# Patient Record
Sex: Female | Born: 1940 | Race: White | Hispanic: No | Smoking: Current every day smoker
Health system: Southern US, Community
[De-identification: ages and names within clinical notes are randomized; demographics above are authoritative.]

## PROBLEM LIST (undated history)

## (undated) DIAGNOSIS — E039 Hypothyroidism, unspecified: Secondary | ICD-10-CM

## (undated) DIAGNOSIS — K219 Gastro-esophageal reflux disease without esophagitis: Secondary | ICD-10-CM

## (undated) DIAGNOSIS — G4700 Insomnia, unspecified: Secondary | ICD-10-CM

## (undated) DIAGNOSIS — M1990 Unspecified osteoarthritis, unspecified site: Secondary | ICD-10-CM

## (undated) DIAGNOSIS — F329 Major depressive disorder, single episode, unspecified: Secondary | ICD-10-CM

## (undated) DIAGNOSIS — F419 Anxiety disorder, unspecified: Secondary | ICD-10-CM

## (undated) DIAGNOSIS — R002 Palpitations: Secondary | ICD-10-CM

## (undated) DIAGNOSIS — K5790 Diverticulosis of intestine, part unspecified, without perforation or abscess without bleeding: Secondary | ICD-10-CM

## (undated) DIAGNOSIS — K862 Cyst of pancreas: Secondary | ICD-10-CM

## (undated) DIAGNOSIS — F32A Depression, unspecified: Secondary | ICD-10-CM

## (undated) DIAGNOSIS — K449 Diaphragmatic hernia without obstruction or gangrene: Secondary | ICD-10-CM

## (undated) DIAGNOSIS — K649 Unspecified hemorrhoids: Secondary | ICD-10-CM

## (undated) DIAGNOSIS — K589 Irritable bowel syndrome without diarrhea: Secondary | ICD-10-CM

## (undated) DIAGNOSIS — E7800 Pure hypercholesterolemia, unspecified: Secondary | ICD-10-CM

## (undated) DIAGNOSIS — J449 Chronic obstructive pulmonary disease, unspecified: Secondary | ICD-10-CM

## (undated) DIAGNOSIS — I10 Essential (primary) hypertension: Secondary | ICD-10-CM

## (undated) DIAGNOSIS — K529 Noninfective gastroenteritis and colitis, unspecified: Secondary | ICD-10-CM

## (undated) DIAGNOSIS — I729 Aneurysm of unspecified site: Secondary | ICD-10-CM

## (undated) DIAGNOSIS — M797 Fibromyalgia: Secondary | ICD-10-CM

## (undated) HISTORY — DX: Cyst of pancreas: K86.2

## (undated) HISTORY — PX: APPENDECTOMY: SHX54

## (undated) HISTORY — DX: Irritable bowel syndrome, unspecified: K58.9

## (undated) HISTORY — DX: Gastro-esophageal reflux disease without esophagitis: K21.9

## (undated) HISTORY — DX: Noninfective gastroenteritis and colitis, unspecified: K52.9

## (undated) HISTORY — DX: Chronic obstructive pulmonary disease, unspecified: J44.9

## (undated) HISTORY — DX: Anxiety disorder, unspecified: F41.9

## (undated) HISTORY — DX: Diaphragmatic hernia without obstruction or gangrene: K44.9

## (undated) HISTORY — DX: Insomnia, unspecified: G47.00

## (undated) HISTORY — DX: Unspecified osteoarthritis, unspecified site: M19.90

## (undated) HISTORY — DX: Aneurysm of unspecified site: I72.9

## (undated) HISTORY — DX: Essential (primary) hypertension: I10

## (undated) HISTORY — DX: Diverticulosis of intestine, part unspecified, without perforation or abscess without bleeding: K57.90

## (undated) HISTORY — DX: Fibromyalgia: M79.7

## (undated) HISTORY — DX: Unspecified hemorrhoids: K64.9

## (undated) HISTORY — PX: ABDOMINAL HYSTERECTOMY: SHX81

## (undated) HISTORY — PX: CHOLECYSTECTOMY: SHX55

## (undated) HISTORY — DX: Palpitations: R00.2

---

## 2001-06-21 ENCOUNTER — Encounter: Payer: Self-pay | Admitting: *Deleted

## 2001-06-21 ENCOUNTER — Ambulatory Visit (HOSPITAL_COMMUNITY): Admission: RE | Admit: 2001-06-21 | Discharge: 2001-06-21 | Payer: Self-pay | Admitting: *Deleted

## 2001-07-02 ENCOUNTER — Encounter: Payer: Self-pay | Admitting: *Deleted

## 2001-07-02 ENCOUNTER — Ambulatory Visit (HOSPITAL_COMMUNITY): Admission: RE | Admit: 2001-07-02 | Discharge: 2001-07-02 | Payer: Self-pay | Admitting: *Deleted

## 2003-05-13 ENCOUNTER — Ambulatory Visit (HOSPITAL_COMMUNITY): Admission: RE | Admit: 2003-05-13 | Discharge: 2003-05-13 | Payer: Self-pay | Admitting: Obstetrics and Gynecology

## 2010-01-09 HISTORY — PX: COLONOSCOPY: SHX174

## 2010-01-09 HISTORY — PX: ESOPHAGOGASTRODUODENOSCOPY ENDOSCOPY: SHX5814

## 2013-12-15 ENCOUNTER — Telehealth: Payer: Self-pay | Admitting: Gastroenterology

## 2013-12-15 ENCOUNTER — Encounter: Payer: Self-pay | Admitting: Gastroenterology

## 2014-01-19 ENCOUNTER — Ambulatory Visit (INDEPENDENT_AMBULATORY_CARE_PROVIDER_SITE_OTHER): Payer: Medicare Other | Admitting: Nurse Practitioner

## 2014-01-19 ENCOUNTER — Other Ambulatory Visit: Payer: Self-pay

## 2014-01-19 ENCOUNTER — Encounter: Payer: Self-pay | Admitting: Nurse Practitioner

## 2014-01-19 ENCOUNTER — Telehealth: Payer: Self-pay | Admitting: Nurse Practitioner

## 2014-01-19 VITALS — BP 141/83 | HR 94 | Temp 97.3°F | Ht 60.0 in | Wt 93.2 lb

## 2014-01-19 DIAGNOSIS — K862 Cyst of pancreas: Secondary | ICD-10-CM

## 2014-01-19 DIAGNOSIS — R1084 Generalized abdominal pain: Secondary | ICD-10-CM

## 2014-01-19 DIAGNOSIS — K589 Irritable bowel syndrome without diarrhea: Secondary | ICD-10-CM

## 2014-01-19 DIAGNOSIS — R109 Unspecified abdominal pain: Secondary | ICD-10-CM | POA: Insufficient documentation

## 2014-01-19 DIAGNOSIS — R634 Abnormal weight loss: Secondary | ICD-10-CM

## 2014-01-19 DIAGNOSIS — K219 Gastro-esophageal reflux disease without esophagitis: Secondary | ICD-10-CM

## 2014-01-19 MED ORDER — PEG-KCL-NACL-NASULF-NA ASC-C 100 G PO SOLR: 1.0000 | Freq: Once | ORAL | Status: DC

## 2014-01-19 NOTE — Telephone Encounter (Signed)
Please request the patient's records from [facility] related to her pancreatic cyst and drainage.

## 2014-01-19 NOTE — Progress Notes (Signed)
Primary Care Physician:  No primary care provider on file. Primary Gastroenterologist:  Dr.  Oneida Alar  Chief Complaint  Patient presents with  . Abdominal Pain  . Irritable Bowel Syndrome    HPI:   74 year old female presents on referral from internal medicine in [place] for gastroparesis, IBS symptoms, acid reflux, dysphagia, unintentional weight loss, weakaness/fatigue, and history of colon polyps. Per PCP records patient was having constipation, started on Amitiza which caused loose stools but continued impaction (per patient.) Last colonoscopy and EGD was in approximately 2012 with Dr. Posey Pronto.   Currently complains of an odd/salty taste in her mouth which has her unable to eat much food and subsequent weight loss. Has lost 25 pounds in the past year. Also having abdominal pain about twice per month which persists until she has a bowel movement. States she is typically unable to have bowel movements but about once a day she needs to physically disimpact herself daily. Her stools are typically hard and small. Had an episode of colitis 2 years ago and has not had any hematochezia since then. States she has dark stools but they're hard and not sticky. Does not take Pepto Bismol or Iron pills. Admits solid food dysphagia, denies pill dysphagia and difficulty with liquids. Has "bad" GERD symptoms. Is currently on Omeprazole 40mg  bid which is ineffective. Attempted Nexium without relief and insurance would not cover it anyway. Takes Bentyl for pain which tends to help. Attempted Amitiza which caused loose stools but per the patient her loose stools were still impacted. Takes Dulcolax and fiber supplement daily. Takes Hydrocodone as needed, typically twice a week. Has not tried Miralax. Has a history of pancreatic cyst which was drained at [facility] and recommended yearly imaging to evaluate, last CT 11/2012. Denies any other upper or lower GI symptoms.  Past Medical History  Diagnosis Date  . Anxiety     . HTN (hypertension)   . Arthritis   . Acid reflux   . Colitis   . Diverticulosis   . IBS (irritable bowel syndrome)   . Fibromyalgia   . Insomnia   . Pancreatic cyst   . COPD (chronic obstructive pulmonary disease)   . Palpitations   . Aneurysm   . Hiatal hernia   . Hemorrhoids     No past surgical history on file.  Current Outpatient Prescriptions  Medication Sig Dispense Refill  . ALPRAZolam (XANAX) 1 MG tablet Take 1 mg by mouth 2 (two) times daily as needed.   2  . aspirin 81 MG tablet Take 81 mg by mouth daily.    Marland Kitchen CALCIUM 500/D 500-200 MG-UNIT per tablet Take 1 tablet by mouth 2 (two) times daily.   2  . Cholecalciferol (VITAMIN D3) 2000 UNITS TABS Take 2,000 Units by mouth 2 (two) times daily.    Marland Kitchen conjugated estrogens (PREMARIN) vaginal cream Place 1 Applicatorful vaginally daily.    Marland Kitchen dicyclomine (BENTYL) 20 MG tablet Take 20 mg by mouth as needed.   2  . docusate sodium (COLACE) 100 MG capsule Take 100 mg by mouth daily as needed for mild constipation.    . DULoxetine (CYMBALTA) 60 MG capsule Take 60 mg by mouth daily.   0  . FIBER PO Take by mouth daily.    Marland Kitchen HYDROcodone-acetaminophen (NORCO/VICODIN) 5-325 MG per tablet Take 1 tablet by mouth every 4 (four) hours as needed.   0  . ipratropium-albuterol (DUONEB) 0.5-2.5 (3) MG/3ML SOLN Take 3 mLs by nebulization.    Marland Kitchen  levothyroxine (SYNTHROID, LEVOTHROID) 50 MCG tablet Take 100 mcg by mouth daily before breakfast.   5  . lisinopril (PRINIVIL,ZESTRIL) 20 MG tablet Take 20 mg by mouth daily.   6  . magnesium hydroxide (MILK OF MAGNESIA) 400 MG/5ML suspension Take 5 mLs by mouth daily as needed for mild constipation.    . Multiple Vitamin (MULTIVITAMIN) capsule Take 1 capsule by mouth daily.    . Omega-3 Fatty Acids (OMEGA-3 FISH OIL) 1200 MG CAPS Take 1,200 mg by mouth daily.    Marland Kitchen omeprazole (PRILOSEC) 40 MG capsule Take 40 mg by mouth 2 (two) times daily.    . Probiotic Product (PROBIOTIC DAILY PO) Take by mouth  daily.    Marland Kitchen tetrahydrozoline-zinc (VISINE-AC) 0.05-0.25 % ophthalmic solution 2 drops as needed.     No current facility-administered medications for this visit.    Allergies as of 01/19/2014 - Review Complete 01/19/2014  Allergen Reaction Noted  . Advil [ibuprofen]  01/19/2014  . Ciprofloxacin  01/19/2014  . Codeine  01/19/2014  . Metronidazole  01/19/2014  . Sulfa antibiotics  01/19/2014    No family history on file.  History   Social History  . Marital Status: Married    Spouse Name: N/A    Number of Children: N/A  . Years of Education: N/A   Occupational History  . Not on file.   Social History Main Topics  . Smoking status: Never Smoker   . Smokeless tobacco: Not on file  . Alcohol Use: No  . Drug Use: No  . Sexual Activity: Not on file   Other Topics Concern  . Not on file   Social History Narrative  . No narrative on file    Review of Systems: Gen: Denies any fever, chills, fatigue, weight loss, lack of appetite.  CV: Denies chest pain, heart palpitations, peripheral edema, syncope.  Resp: Denies shortness of breath at rest or with exertion. Denies wheezing or cough.  GI: Denies dysphagia or odynophagia. Denies jaundice, hematemesis, fecal incontinence. GU : Denies urinary burning, urinary frequency, urinary hesitancy MS: Denies joint pain, muscle weakness, cramps, or limitation of movement.  Derm: Denies rash, itching, dry skin Psych: Denies depression, anxiety, memory loss, and confusion Heme: Denies bruising, bleeding, and enlarged lymph nodes.  Physical Exam: BP 141/83 mmHg  Pulse 94  Temp(Src) 97.3 F (36.3 C) (Oral)  Ht 5' (1.524 m)  Wt 93 lb 3.2 oz (42.275 kg)  BMI 18.20 kg/m2 General:   Alert and oriented. Pleasant and cooperative. Well-nourished and well-developed.  Head:  Normocephalic and atraumatic. Eyes:  Without icterus, sclera clear and conjunctiva pink.  Ears:  Normal auditory acuity. Mouth:  No deformity or lesions, oral mucosa  pink. No OP edema. Neck:  Supple, without mass or thyromegaly. Lungs:  Clear to auscultation bilaterally. No wheezes, rales, or rhonchi. No distress.  Heart:  S1, S2 present without murmurs appreciated.  Abdomen:  +BS, soft, non-distended. TTP lower abdomen. No HSM noted. No guarding or rebound. No masses appreciated.  Rectal:  Deferred  Pulses:  Normal DP pulses noted. Extremities:  Without clubbing or edema. Neurologic:  Alert and  oriented x4;  grossly normal neurologically. Skin:  Intact without significant lesions or rashes. Cervical Nodes:  No significant cervical adenopathy. Psych:  Alert and cooperative. Normal mood and affect.     01/19/2014 2:42 PM

## 2014-01-19 NOTE — Patient Instructions (Signed)
1. Try taking OTC Miralax every other day and see if that helps with your bowel movements. 2. We will try you on a different acid reflux pill called Dexilant because you have tried and failed Prilosec and Nexium. We will give you samples to try and please let us know if it works, in which case we can call in a prescription for it. 3. We will schedule a CT scan to evaluate your symptoms including abdominal pain, unintentional weight loss, and history of pancreatic cyst. 4. We will schedule you for a colonoscopy and endoscopy to further evaluate your symptoms. 5. Further recommendations to follow based on the results of your procedures.

## 2014-01-19 NOTE — Telephone Encounter (Signed)
I faxed [facility] a signed release for her medical records this afternoon before she left. Once I get them I will put in your chair.

## 2014-01-20 NOTE — Assessment & Plan Note (Signed)
History of pancreatic cyst s/p drainage at [facility]. Typically has a CT scan yearly to evaluate. Also now with abdominal pain, constipation/impaction, and GERD symptoms. Will order a CT abdomen and pelvis as she is due as well to evaluate for any acute process to explain her symptoms.

## 2014-01-20 NOTE — Assessment & Plan Note (Signed)
Abdominal pain with a history of pancreatic cyst s/p drainage at [facility]. Pain typically with some relief with BM, bentyl, and hydrocodone. Likely related to IBS symptoms, but patient did not tolerate Amitiza. Will try miralax for gentler effect, CT scan to rule out other acute process, as well as TCS/EGD with Dr. Oneida Alar.  Proceed with colonoscopy + EGD with Dr. Oneida Alar in the near future. The risks, benefits, and alternatives have been discussed in detail with the patient. They state understanding and desire to proceed.

## 2014-01-20 NOTE — Assessment & Plan Note (Signed)
Persistent GERD symptoms failed omeprazole and nexium. Will trial Dexilant for better control with sample x 2 weeks. If effective can send in an Rx.  Will also plan on add on EGD to colonoscopy to evaluate for gastritis/esophagitis as well as dysphagia.  Proceed with colonoscopy + EGD with Dr. Oneida Alar in the near future. The risks, benefits, and alternatives have been discussed in detail with the patient. They state understanding and desire to proceed.

## 2014-01-20 NOTE — Assessment & Plan Note (Signed)
Unintentional weight loss of 25 pounds in the past year with abdominal pain and a history of pancreatic cyst s/p drainage at [facility]. Bentyl helps with her pain. Also with weakness and history of colon polyps. Will order CT for symptoms given history and presentation to rule out acute process. Will followup with TCS + EGD with Dr. Oneida Alar.  Proceed with colonoscopy + EGD with Dr. Oneida Alar in the near future. The risks, benefits, and alternatives have been discussed in detail with the patient. They state understanding and desire to proceed.

## 2014-01-20 NOTE — Assessment & Plan Note (Signed)
Patient with abdominal pain sometimes relieved with a bowel movement, constipation which she states she has to "dig out." Was trialed on Amitiza which cause "loose stools that were still impacted." Continue Bentyl which is somewhat effective for her abdominal pain and try Miralax for gentler effect over Amitiza. Cannot rule out more acute process, however. Will follow with TCS + EGD with Dr. Oneida Alar.  Proceed with colonoscopy + EGD with Dr. Oneida Alar in the near future. The risks, benefits, and alternatives have been discussed in detail with the patient. They state understanding and desire to proceed.

## 2014-01-22 ENCOUNTER — Other Ambulatory Visit: Payer: Self-pay

## 2014-01-22 ENCOUNTER — Ambulatory Visit (HOSPITAL_COMMUNITY)
Admission: RE | Admit: 2014-01-22 | Discharge: 2014-01-22 | Disposition: A | Discharge status: Home / Self Care | Payer: Medicare Other | Source: Ambulatory Visit | Attending: Nurse Practitioner | Admitting: Nurse Practitioner

## 2014-01-22 ENCOUNTER — Encounter (HOSPITAL_COMMUNITY): Payer: Self-pay

## 2014-01-22 ENCOUNTER — Encounter (HOSPITAL_COMMUNITY)
Admission: RE | Admit: 2014-01-22 | Discharge: 2014-01-22 | Disposition: A | Discharge status: Home / Self Care | Payer: Medicare Other | Source: Ambulatory Visit | Attending: Gastroenterology | Admitting: Gastroenterology

## 2014-01-22 DIAGNOSIS — R634 Abnormal weight loss: Secondary | ICD-10-CM | POA: Insufficient documentation

## 2014-01-22 DIAGNOSIS — R1084 Generalized abdominal pain: Secondary | ICD-10-CM | POA: Diagnosis not present

## 2014-01-22 HISTORY — DX: Major depressive disorder, single episode, unspecified: F32.9

## 2014-01-22 HISTORY — DX: Depression, unspecified: F32.A

## 2014-01-22 HISTORY — DX: Hypothyroidism, unspecified: E03.9

## 2014-01-22 HISTORY — DX: Pure hypercholesterolemia, unspecified: E78.00

## 2014-01-22 LAB — CBC
HEMATOCRIT: 37.8 % (ref 36.0–46.0)
HEMOGLOBIN: 12.6 g/dL (ref 12.0–15.0)
MCH: 30.5 pg (ref 26.0–34.0)
MCHC: 33.3 g/dL (ref 30.0–36.0)
MCV: 91.5 fL (ref 78.0–100.0)
PLATELETS: 276 10*3/uL (ref 150–400)
RBC: 4.13 MIL/uL (ref 3.87–5.11)
RDW: 12.4 % (ref 11.5–15.5)
WBC: 7.5 10*3/uL (ref 4.0–10.5)

## 2014-01-22 LAB — BASIC METABOLIC PANEL
ANION GAP: 6 (ref 5–15)
BUN: 13 mg/dL (ref 6–23)
CO2: 31 mmol/L (ref 19–32)
CREATININE: 0.9 mg/dL (ref 0.50–1.10)
Calcium: 9.7 mg/dL (ref 8.4–10.5)
Chloride: 99 mEq/L (ref 96–112)
GFR, EST AFRICAN AMERICAN: 73 mL/min — AB (ref >90)
GFR, EST NON AFRICAN AMERICAN: 63 mL/min — AB (ref >90)
Glucose, Bld: 75 mg/dL (ref 70–99)
Potassium: 4.4 mmol/L (ref 3.5–5.1)
Sodium: 136 mmol/L (ref 135–145)

## 2014-01-22 LAB — POCT I-STAT CREATININE: Creatinine, Ser: 1 mg/dL (ref 0.50–1.10)

## 2014-01-22 MED ORDER — IOHEXOL 300 MG/ML  SOLN
100.0000 mL | Freq: Once | INTRAMUSCULAR | Status: AC | PRN
  Administered 2014-01-22: 100 mL via INTRAVENOUS

## 2014-01-22 MED ORDER — NA SULFATE-K SULFATE-MG SULF 17.5-3.13-1.6 GM/177ML PO SOLN: 1.0000 | ORAL | Status: DC

## 2014-01-22 NOTE — Patient Instructions (Signed)
GEORGENA WEISHEIT  01/22/2014   Your procedure is scheduled on:   01/27/2014  Report to [facility] at  645  AM.  Call this number if you have problems the morning of surgery: [phone]   Remember:   Do not eat food or drink liquids after midnight.   Take these medicines the morning of surgery with A SIP OF WATER: xanax, cymbalta, norco, synthroid, lisinopril  Do not wear jewelry, make-up or nail polish.  Do not wear lotions, powders, or perfumes.  Do not shave 48 hours prior to surgery. Men may shave face and neck.  Do not bring valuables to the hospital.  [facility] is not responsible for any belongings or valuables.               Contacts, dentures or bridgework may not be worn into surgery.  Leave suitcase in the car. After surgery it may be brought to your room.  For patients admitted to the hospital, discharge time is determined by your treatment team.               Patients discharged the day of surgery will not be allowed to drive home.  Name and phone number of your driver: family  Special Instructions: N/A   Please read over the following fact sheets that you were given: Pain Booklet, Coughing and Deep Breathing, Surgical Site Infection Prevention, Anesthesia Post-op Instructions and Care and Recovery After Surgery Esophagogastroduodenoscopy Esophagogastroduodenoscopy (EGD) is a procedure to examine the lining of the esophagus, stomach, and first part of the small intestine (duodenum). A long, flexible, lighted tube with a camera attached (endoscope) is inserted down the throat to view these organs. This procedure is done to detect problems or abnormalities, such as inflammation, bleeding, ulcers, or growths, in order to treat them. The procedure lasts about 5-20 minutes. It is usually an outpatient procedure, but it may need to be performed in emergency cases in the hospital. LET YOUR CAREGIVER KNOW ABOUT:   Allergies to food or medicine.  All medicines you are  taking, including vitamins, herbs, eyedrops, and over-the-counter medicines and creams.  Use of steroids (by mouth or creams).  Previous problems you or members of your family have had with the use of anesthetics.  Any blood disorders you have.  Previous surgeries you have had.  Other health problems you have.  Possibility of pregnancy, if this applies. RISKS AND COMPLICATIONS  Generally, EGD is a safe procedure. However, as with any procedure, complications can occur. Possible complications include:  Infection.  Bleeding.  Tearing (perforation) of the esophagus, stomach, or duodenum.  Difficulty breathing or not being able to breath.  Excessive sweating.  Spasms of the larynx.  Slowed heartbeat.  Low blood pressure. BEFORE THE PROCEDURE  Do not eat or drink anything for 6-8 hours before the procedure or as directed by your caregiver.  Ask your caregiver about changing or stopping your regular medicines.  If you wear dentures, be prepared to remove them before the procedure.  Arrange for someone to drive you home after the procedure. PROCEDURE   A vein will be accessed to give medicines and fluids. A medicine to relax you (sedative) and a pain reliever will be given through that access into the vein.  A numbing medicine (local anesthetic) may be sprayed on your throat for comfort and to stop you from gagging or coughing.  A mouth guard may be placed in your mouth to protect your  teeth and to keep you from biting on the endoscope.  You will be asked to lie on your left side.  The endoscope is inserted down your throat and into the esophagus, stomach, and duodenum.  Air is put through the endoscope to allow your caregiver to view the lining of your esophagus clearly.  The esophagus, stomach, and duodenum is then examined. During the exam, your caregiver may:  Remove tissue to be examined under a microscope (biopsy) for inflammation, infection, or other medical  problems.  Remove growths.  Remove objects (foreign bodies) that are stuck.  Treat any bleeding with medicines or other devices that stop tissues from bleeding (hot cautery, clipping devices).  Widen (dilate) or stretch narrowed areas of the esophagus and stomach.  The endoscope will then be withdrawn. AFTER THE PROCEDURE  You will be taken to a recovery area to be monitored. You will be able to go home once you are stable and alert.  Do not eat or drink anything until the local anesthetic and numbing medicines have worn off. You may choke.  It is normal to feel bloated, have pain with swallowing, or have a sore throat for a short time. This will wear off.  Your caregiver should be able to discuss his or her findings with you. It will take longer to discuss the test results if any biopsies were taken. Document Released: 04/28/2004 Document Revised: 05/12/2013 Document Reviewed: 11/29/2011 [facility] Patient Information 2015 [place], [place]. This information is not intended to replace advice given to you by your health care provider. Make sure you discuss any questions you have with your health care provider. Colonoscopy A colonoscopy is an exam to look at the entire large intestine (colon). This exam can help find problems such as tumors, polyps, inflammation, and areas of bleeding. The exam takes about 1 hour.  LET [facility] CARE PROVIDER KNOW ABOUT:   Any allergies you have.  All medicines you are taking, including vitamins, herbs, eye drops, creams, and over-the-counter medicines.  Previous problems you or members of your family have had with the use of anesthetics.  Any blood disorders you have.  Previous surgeries you have had.  Medical conditions you have. RISKS AND COMPLICATIONS  Generally, this is a safe procedure. However, as with any procedure, complications can occur. Possible complications include:  Bleeding.  Tearing or rupture of the colon wall.  Reaction to  medicines given during the exam.  Infection (rare). BEFORE THE PROCEDURE   Ask your health care provider about changing or stopping your regular medicines.  You may be prescribed an oral bowel prep. This involves drinking a large amount of medicated liquid, starting the day before your procedure. The liquid will cause you to have multiple loose stools until your stool is almost clear or light green. This cleans out your colon in preparation for the procedure.  Do not eat or drink anything else once you have started the bowel prep, unless your health care provider tells you it is safe to do so.  Arrange for someone to drive you home after the procedure. PROCEDURE   You will be given medicine to help you relax (sedative).  You will lie on your side with your knees bent.  A long, flexible tube with a light and camera on the end (colonoscope) will be inserted through the rectum and into the colon. The camera sends video back to a computer screen as it moves through the colon. The colonoscope also releases carbon dioxide gas to  inflate the colon. This helps your health care provider see the area better.  During the exam, your health care provider may take a small tissue sample (biopsy) to be examined under a microscope if any abnormalities are found.  The exam is finished when the entire colon has been viewed. AFTER THE PROCEDURE   Do not drive for 24 hours after the exam.  You may have a small amount of blood in your stool.  You may pass moderate amounts of gas and have mild abdominal cramping or bloating. This is caused by the gas used to inflate your colon during the exam.  Ask when your test results will be ready and how you will get your results. Make sure you get your test results. Document Released: 12/24/1999 Document Revised: 10/16/2012 Document Reviewed: 09/02/2012 [facility] Patient Information 2015 [place], [place]. This information is not intended to replace advice given to you  by your health care provider. Make sure you discuss any questions you have with your health care provider. PATIENT INSTRUCTIONS POST-ANESTHESIA  IMMEDIATELY FOLLOWING SURGERY:  Do not drive or operate machinery for the first twenty four hours after surgery.  Do not make any important decisions for twenty four hours after surgery or while taking narcotic pain medications or sedatives.  If you develop intractable nausea and vomiting or a severe headache please notify your doctor immediately.  FOLLOW-UP:  Please make an appointment with your surgeon as instructed. You do not need to follow up with anesthesia unless specifically instructed to do so.  WOUND CARE INSTRUCTIONS (if applicable):  Keep a dry clean dressing on the anesthesia/puncture wound site if there is drainage.  Once the wound has quit draining you may leave it open to air.  Generally you should leave the bandage intact for twenty four hours unless there is drainage.  If the epidural site drains for more than 36-48 hours please call the anesthesia department.  QUESTIONS?:  Please feel free to call your physician or the hospital operator if you have any questions, and they will be happy to assist you.

## 2014-01-22 NOTE — Pre-Procedure Instructions (Signed)
Patient given information to sign up for my chart at home. 

## 2014-01-22 NOTE — Progress Notes (Addendum)
REVIEWED. AGREE. NO ADDITIONAL RECOMMENDATIONS. Jan 22, 2014-PT CAN'T TOLERATE 4L PEG PREP. INSURANCE WON'T PAY FOR [place], [place], OR PREPOIK. LABS REVIEWED. PREPOPIK SAMPLE OK.

## 2014-01-23 NOTE — Progress Notes (Signed)
Quick Note:  iStat Cr normal. ______

## 2014-01-26 NOTE — Progress Notes (Signed)
No pcp per patient 

## 2014-01-27 ENCOUNTER — Ambulatory Visit (HOSPITAL_COMMUNITY): Payer: Medicare Other | Admitting: Anesthesiology

## 2014-01-27 ENCOUNTER — Encounter (HOSPITAL_COMMUNITY)
Admission: RE | Disposition: A | Discharge status: Home / Self Care | Payer: Self-pay | Source: Ambulatory Visit | Attending: Gastroenterology

## 2014-01-27 ENCOUNTER — Encounter (HOSPITAL_COMMUNITY): Payer: Self-pay | Admitting: *Deleted

## 2014-01-27 ENCOUNTER — Ambulatory Visit (HOSPITAL_COMMUNITY)
Admission: RE | Admit: 2014-01-27 | Discharge: 2014-01-27 | Disposition: A | Discharge status: Home / Self Care | Payer: Medicare Other | Source: Ambulatory Visit | Attending: Gastroenterology | Admitting: Gastroenterology

## 2014-01-27 DIAGNOSIS — K589 Irritable bowel syndrome without diarrhea: Secondary | ICD-10-CM | POA: Insufficient documentation

## 2014-01-27 DIAGNOSIS — K317 Polyp of stomach and duodenum: Secondary | ICD-10-CM | POA: Insufficient documentation

## 2014-01-27 DIAGNOSIS — K2950 Unspecified chronic gastritis without bleeding: Secondary | ICD-10-CM | POA: Diagnosis not present

## 2014-01-27 DIAGNOSIS — F329 Major depressive disorder, single episode, unspecified: Secondary | ICD-10-CM | POA: Diagnosis not present

## 2014-01-27 DIAGNOSIS — K219 Gastro-esophageal reflux disease without esophagitis: Secondary | ICD-10-CM | POA: Insufficient documentation

## 2014-01-27 DIAGNOSIS — E039 Hypothyroidism, unspecified: Secondary | ICD-10-CM | POA: Insufficient documentation

## 2014-01-27 DIAGNOSIS — A048 Other specified bacterial intestinal infections: Secondary | ICD-10-CM | POA: Insufficient documentation

## 2014-01-27 DIAGNOSIS — D122 Benign neoplasm of ascending colon: Secondary | ICD-10-CM | POA: Insufficient documentation

## 2014-01-27 DIAGNOSIS — F419 Anxiety disorder, unspecified: Secondary | ICD-10-CM | POA: Insufficient documentation

## 2014-01-27 DIAGNOSIS — D128 Benign neoplasm of rectum: Secondary | ICD-10-CM

## 2014-01-27 DIAGNOSIS — Z79899 Other long term (current) drug therapy: Secondary | ICD-10-CM | POA: Diagnosis not present

## 2014-01-27 DIAGNOSIS — I10 Essential (primary) hypertension: Secondary | ICD-10-CM | POA: Diagnosis not present

## 2014-01-27 DIAGNOSIS — F17210 Nicotine dependence, cigarettes, uncomplicated: Secondary | ICD-10-CM | POA: Insufficient documentation

## 2014-01-27 DIAGNOSIS — R609 Edema, unspecified: Secondary | ICD-10-CM | POA: Diagnosis not present

## 2014-01-27 DIAGNOSIS — G4700 Insomnia, unspecified: Secondary | ICD-10-CM | POA: Insufficient documentation

## 2014-01-27 DIAGNOSIS — Z7982 Long term (current) use of aspirin: Secondary | ICD-10-CM | POA: Diagnosis not present

## 2014-01-27 DIAGNOSIS — E780 Pure hypercholesterolemia: Secondary | ICD-10-CM | POA: Diagnosis not present

## 2014-01-27 DIAGNOSIS — K319 Disease of stomach and duodenum, unspecified: Secondary | ICD-10-CM

## 2014-01-27 DIAGNOSIS — M797 Fibromyalgia: Secondary | ICD-10-CM | POA: Diagnosis not present

## 2014-01-27 DIAGNOSIS — J449 Chronic obstructive pulmonary disease, unspecified: Secondary | ICD-10-CM | POA: Diagnosis not present

## 2014-01-27 DIAGNOSIS — R109 Unspecified abdominal pain: Secondary | ICD-10-CM | POA: Diagnosis present

## 2014-01-27 DIAGNOSIS — K621 Rectal polyp: Secondary | ICD-10-CM | POA: Insufficient documentation

## 2014-01-27 DIAGNOSIS — D125 Benign neoplasm of sigmoid colon: Secondary | ICD-10-CM | POA: Insufficient documentation

## 2014-01-27 DIAGNOSIS — R112 Nausea with vomiting, unspecified: Secondary | ICD-10-CM

## 2014-01-27 DIAGNOSIS — R634 Abnormal weight loss: Secondary | ICD-10-CM

## 2014-01-27 HISTORY — PX: COLONOSCOPY WITH PROPOFOL: SHX5780

## 2014-01-27 HISTORY — PX: ESOPHAGOGASTRODUODENOSCOPY (EGD) WITH PROPOFOL: SHX5813

## 2014-01-27 HISTORY — PX: POLYPECTOMY: SHX5525

## 2014-01-27 HISTORY — PX: BIOPSY: SHX5522

## 2014-01-27 SURGERY — COLONOSCOPY WITH PROPOFOL
Anesthesia: Monitor Anesthesia Care

## 2014-01-27 MED ORDER — FENTANYL CITRATE 0.05 MG/ML IJ SOLN
INTRAMUSCULAR | Status: AC
  Filled 2014-01-27: qty 2

## 2014-01-27 MED ORDER — FENTANYL CITRATE 0.05 MG/ML IJ SOLN: 25.0000 ug | INTRAMUSCULAR | Status: DC | PRN

## 2014-01-27 MED ORDER — EPHEDRINE SULFATE 50 MG/ML IJ SOLN
INTRAMUSCULAR | Status: DC | PRN
  Administered 2014-01-27: 5 mg via INTRAVENOUS

## 2014-01-27 MED ORDER — ONDANSETRON HCL 4 MG/2ML IJ SOLN: 4.0000 mg | Freq: Once | INTRAMUSCULAR | Status: DC | PRN

## 2014-01-27 MED ORDER — MIDAZOLAM HCL 5 MG/5ML IJ SOLN
INTRAMUSCULAR | Status: DC | PRN
  Administered 2014-01-27 (×2): 1 mg via INTRAVENOUS

## 2014-01-27 MED ORDER — LIDOCAINE HCL (CARDIAC) 10 MG/ML IV SOLN
INTRAVENOUS | Status: DC | PRN
  Administered 2014-01-27: 25 mg via INTRAVENOUS

## 2014-01-27 MED ORDER — FENTANYL CITRATE 0.05 MG/ML IJ SOLN
25.0000 ug | INTRAMUSCULAR | Status: AC
  Administered 2014-01-27 (×2): 25 ug via INTRAVENOUS

## 2014-01-27 MED ORDER — LACTATED RINGERS IV SOLN
INTRAVENOUS | Status: DC
  Administered 2014-01-27: 1000 mL via INTRAVENOUS

## 2014-01-27 MED ORDER — LIDOCAINE VISCOUS 2 % MT SOLN
15.0000 mL | Freq: Once | OROMUCOSAL | Status: AC
  Administered 2014-01-27: 15 mL via OROMUCOSAL
  Filled 2014-01-27: qty 15

## 2014-01-27 MED ORDER — PROPOFOL INFUSION 10 MG/ML OPTIME
INTRAVENOUS | Status: DC | PRN
  Administered 2014-01-27: 75 ug/kg/min via INTRAVENOUS
  Administered 2014-01-27: 09:00:00 via INTRAVENOUS

## 2014-01-27 MED ORDER — FENTANYL CITRATE 0.05 MG/ML IJ SOLN
INTRAMUSCULAR | Status: DC | PRN
  Administered 2014-01-27 (×4): 25 ug via INTRAVENOUS

## 2014-01-27 MED ORDER — MIDAZOLAM HCL 2 MG/2ML IJ SOLN
INTRAMUSCULAR | Status: AC
  Filled 2014-01-27: qty 2

## 2014-01-27 MED ORDER — SPOT INK MARKER SYRINGE KIT
PACK | SUBMUCOSAL | Status: DC | PRN
  Administered 2014-01-27: 2 mL via SUBMUCOSAL

## 2014-01-27 MED ORDER — DEXLANSOPRAZOLE 60 MG PO CPDR: DELAYED_RELEASE_CAPSULE | ORAL | Status: DC

## 2014-01-27 MED ORDER — ONDANSETRON HCL 4 MG/2ML IJ SOLN
4.0000 mg | Freq: Once | INTRAMUSCULAR | Status: AC
  Administered 2014-01-27: 4 mg via INTRAVENOUS
  Filled 2014-01-27: qty 2

## 2014-01-27 MED ORDER — LACTATED RINGERS IV SOLN
INTRAVENOUS | Status: DC | PRN
  Administered 2014-01-27: 07:00:00 via INTRAVENOUS

## 2014-01-27 MED ORDER — PROPOFOL 10 MG/ML IV BOLUS
INTRAVENOUS | Status: AC
  Filled 2014-01-27: qty 20

## 2014-01-27 MED ORDER — MIDAZOLAM HCL 2 MG/2ML IJ SOLN
1.0000 mg | INTRAMUSCULAR | Status: DC | PRN
Start: 2014-01-27 — End: 2014-01-27
  Administered 2014-01-27: 2 mg via INTRAVENOUS
  Filled 2014-01-27: qty 2

## 2014-01-27 MED ORDER — STERILE WATER FOR IRRIGATION IR SOLN
Status: DC | PRN
  Administered 2014-01-27: 08:00:00

## 2014-01-27 MED ORDER — LIDOCAINE VISCOUS 2 % MT SOLN
OROMUCOSAL | Status: DC | PRN
  Administered 2014-01-27: 1 via OROMUCOSAL

## 2014-01-27 SURGICAL SUPPLY — 27 items
BLOCK BITE 60FR ADLT L/F BLUE (MISCELLANEOUS) ×3 IMPLANT
DEVICE CLIP HEMOSTAT 235CM (CLIP) ×12 IMPLANT
ELECT REM PT RETURN 9FT ADLT (ELECTROSURGICAL) ×3
ELECTRODE REM PT RTRN 9FT ADLT (ELECTROSURGICAL) ×1 IMPLANT
FCP BXJMBJMB 240X2.8X (CUTTING FORCEPS)
FLOOR PAD 36X40 (MISCELLANEOUS) ×3
FORCEPS BIOP RAD 4 LRG CAP 4 (CUTTING FORCEPS) ×3 IMPLANT
FORCEPS BIOP RJ4 240 W/NDL (CUTTING FORCEPS)
FORCEPS BXJMBJMB 240X2.8X (CUTTING FORCEPS) IMPLANT
FORMALIN 10 PREFIL 20ML (MISCELLANEOUS) ×9 IMPLANT
INJECTOR/SNARE I SNARE (MISCELLANEOUS) IMPLANT
KIT CLEAN ENDO COMPLIANCE (KITS) ×3 IMPLANT
LUBRICANT JELLY 4.5OZ STERILE (MISCELLANEOUS) ×3 IMPLANT
MANIFOLD NEPTUNE II (INSTRUMENTS) ×3 IMPLANT
MARKER SPOT ENDO TATTOO 5ML (MISCELLANEOUS) ×1 IMPLANT
NEEDLE SCLEROTHERAPY 25GX240 (NEEDLE) ×3 IMPLANT
PAD FLOOR 36X40 (MISCELLANEOUS) ×1 IMPLANT
PROBE APC STR FIRE (PROBE) IMPLANT
PROBE INJECTION GOLD (MISCELLANEOUS)
PROBE INJECTION GOLD 7FR (MISCELLANEOUS) IMPLANT
SNARE SHORT THROW 13M SML OVAL (MISCELLANEOUS) ×6 IMPLANT
SPOT EX ENDOSCOPIC TATTOO (MISCELLANEOUS) ×2
SYR 50ML LL SCALE MARK (SYRINGE) ×3 IMPLANT
SYR INFLATION 60ML (SYRINGE) IMPLANT
TRAP SPECIMEN MUCOUS 40CC (MISCELLANEOUS) ×3 IMPLANT
TUBING IRRIGATION ENDOGATOR (MISCELLANEOUS) ×3 IMPLANT
WATER STERILE IRR 1000ML POUR (IV SOLUTION) ×3 IMPLANT

## 2014-01-27 NOTE — Anesthesia Preprocedure Evaluation (Signed)
Anesthesia Evaluation  Patient identified by MRN, date of birth, ID band Patient awake    Reviewed: Allergy & Precautions, NPO status , Patient's Chart, lab work & pertinent test results  Airway Mallampati: II  TM Distance: >3 FB     Dental  (+) Edentulous Upper, Edentulous Lower   Pulmonary COPDCurrent Smoker,  breath sounds clear to auscultation        Cardiovascular hypertension, Pt. on medications Rhythm:Regular Rate:Normal     Neuro/Psych PSYCHIATRIC DISORDERS Anxiety Depression    GI/Hepatic hiatal hernia, GERD-  Medicated,  Endo/Other  Hypothyroidism   Renal/GU      Musculoskeletal  (+) Arthritis -, Fibromyalgia -  Abdominal   Peds  Hematology   Anesthesia Other Findings   Reproductive/Obstetrics                             Anesthesia Physical Anesthesia Plan  ASA: III  Anesthesia Plan: MAC   Post-op Pain Management:    Induction: Intravenous  Airway Management Planned: Simple Face Mask  Additional Equipment:   Intra-op Plan:   Post-operative Plan:   Informed Consent: I have reviewed the patients History and Physical, chart, labs and discussed the procedure including the risks, benefits and alternatives for the proposed anesthesia with the patient or authorized representative who has indicated his/her understanding and acceptance.     Plan Discussed with:   Anesthesia Plan Comments:         Anesthesia Quick Evaluation

## 2014-01-27 NOTE — Op Note (Signed)
NAME:  Sarah Braun, Sarah Braun NO.:  [id number]  MEDICAL RECORD NO.:  [id number]  LOCATION:  [facility]                          FACILITY:  [facility]  PHYSICIAN:  Barney Drain, M.D.     DATE OF BIRTH:  1940/10/28  DATE OF PROCEDURE:  01/27/2014 DATE OF DISCHARGE:  01/27/2014                              OPERATIVE REPORT   REFERRING PROVIDER:  Outside of network.  PROCEDURE: 1. Colonoscopy with snare cautery and cold forceps polypectomy,     submucosal injection of SPOT, and placement of 2 [employer] scientific     resolution clips. 2. Esophagogastroduodenoscopy with cold forceps biopsy and placement     of 2 [employer] resolution clips.  INDICATION FOR EXAMINATION:  [name] is a 74 year old female with a history of irritable bowel syndrome and a small pancreatic cyst.  She was seen and evaluated by GI in July 2014 at [facility].  She weighed 97 pounds at that time.  She was seen and evaluated for nausea and vomiting, weight loss, and no etiology was identified.  She did have a tissue transglutaminase IgA which was normal.  Her last endoscopic ultrasound was January 2014 which showed a 13 mm pancreatic cyst.  She had a colonoscopy by an outside physician as well as an upper endoscopy which revealed simple adenomas in the colon (3) and hyperplastic polyps in the stomach.  She also had esophageal dilation to 18 mm.  She has a past surgical history of cholecystectomy, appendectomy, and hysterectomy.  Dr. Carlton Adam [facility] recommended Amitiza or Linzess.  The patient currently has Linzess samples.  She reports that because she has lost her taste that she has had a decreased p.o. intake.  She is currently undergoing evaluation by Neurology, AND ENT.  FINDINGS: 1. Normal terminal ileum, approximately 10 cm visualized. 2. 4 mm sessile ascending colon polyp removed via cold forceps.  One 3 mm    sessile rectal polyp, and one 4 mm sessile sigmoid colon polyp removed via cold  forceps. 3. Large flat sigmoid colon polyp removed via cold forceps and snare     cautery.  The polypectomy base was cauterized with the snare.  The     polypectomy base was tattooed with SPOT (2 mL).  Two Boston     resolution clips were placed to close the polypectomy base to     prevent postpolypectomy bleeding. 4. Extremely redundant left colon requiring manual pressure and the     patient being placed in the prone position to successfully intubate     the cecum. 5. Normal esophageal mucosa. 6. Mild erythema and edema in the body and the antrum.  Cold forceps     biopsies obtained to evaluate for H pylori gastritis.  Multiple     sessile gastric polyps ranging from 2-6 mm seen in the proximal     stomach.  Cold forceps biopsies obtained.  Scope trauma caused a     mucosal tear.  Two [employer] scientific resolution clips placed. 7. Normal duodenum bulb and second portion of the duodenum.  Cold     forceps biopsies obtained to evaluate for celiac sprue.  IMPRESSION: 1. No obvious source for nausea,  vomiting, and weight loss identified. 2. Four colon polyps removed. 3. Redundant left colon. 4. Mild gastritis. 5. Too-numerous-to-count gastric polyps in the proximal stomach. 6. Mucosal tear in the cardia due to scope trauma.  RECOMMENDATIONS: 1. Hold aspirin.  Restart on January 26. 2. Start using Bentyl. 3. No MRI for 30 days. 4. Use Carnation instant breakfast, Boost or Ensure 4 times a day to     prevent weight loss. 5. Drink water to keep urine light yellow. 6. Continue Linzess and Probiotic daily. 7. Follow a high-fiber low-fat diet. 8. Await biopsies. 9. Next colonoscopy in 1-3 years with an overtube and propofol. 10.Follow up in May 2016.  MEDICATIONS:  Propofol.  PROCEDURE TECHNIQUE:  Physical exam was performed.  Informed consent was obtained with the patient explained the benefits, risks, and alternatives to the procedure.  The patient was connected, monitored, and  placed in the left lateral position.  Continuous oxygen was provided by nasal cannula.  IV medicine administered an indwelling cannula. After administration of sedation and rectal exam, the patient's rectum was intubated, scope advanced under direct visualization to the ileum. The scope was removed followed by careful exam of color, texture, anatomy, and integrity of the mucosa on the way out.  The patient was repositioned.  The patient's esophagus was intubated and the scope was passed under direct visualization to the second portion of the duodenum.  The scope was removed followed by careful examining the color, texture, anatomy, and integrity of the mucosa on the way out. The patient was recovered an endoscopy and discharged home in satisfactory condition.     Barney Drain, M.D.     SF/MEDQ  D:  01/27/2014  T:  01/27/2014  Job:  [id number]

## 2014-01-27 NOTE — Anesthesia Postprocedure Evaluation (Signed)
  Anesthesia Post-op Note  Patient: Sarah Braun  Procedure(s) Performed: Procedure(s): COLONOSCOPY WITH PROPOFOL; IN CECUM AT 0850, out @ 0940 ; withdrawal time 50 minutes; TWO METAL CLIPS PLACED IN COLON (N/A) ESOPHAGOGASTRODUODENOSCOPY (EGD) WITH PROPOFOL; TWO METAL CLIPS PLACED IN STOMACH (N/A) POLYPECTOMY (N/A) BIOPSY (N/A)  Patient Location: PACU  Anesthesia Type:MAC  Level of Consciousness: awake, alert , oriented and patient cooperative  Airway and Oxygen Therapy: Patient Spontanous Breathing and Patient connected to face mask oxygen  Post-op Pain: none  Post-op Assessment: Post-op Vital signs reviewed, Patient's Cardiovascular Status Stable, Respiratory Function Stable, Patent Airway and No signs of Nausea or vomiting  Post-op Vital Signs: Reviewed and stable  Last Vitals:  Filed Vitals:   01/27/14 0820  BP: 145/77  Pulse:   Temp:   Resp: 16    Complications: No apparent anesthesia complications

## 2014-01-27 NOTE — Discharge Instructions (Signed)
You had 4 COLON polyps removed. You have MODERATE gastritis,  AND GASTRIC POLYPS.I PLACED 2 METAL CLIPS IN YOUR STOMACH BECAUSE THE SCOPE CAUSED A SMALL TEAR IN THE LINING OF YOUR STOMACH. I PLACED A 2 CLIPS IN YOUR COLON TO PREVENT BLEEDING IN 7-10 DAYS. I TATTOED YOUR SIGMOID COLON. YOU HAVE A FLOPPY LEFT COLON.   I biopsied your stomach AND SMALL BOWEL.    HOLD ASPIRIN. RE-START JAN 26.  STOP USING BENTYL. IT CAUSES CONSITIPATION.  NO MRI FOR 30 DAYS DUE TO METAL CLIP PLACEMENT IN THE COLON AND STOMACH.   USE CARNATION INSTANT BREAKFAST, BOOST, OR ENSURE FOUR TIMES A DAYTO PREVENT WEIGHT LOSS.  DRINK WATER TO KEEP YOUR URINE LIGHT YELLOW.  Continue LINZESS TO PREVENT CONSTIPATION.  TAKE A PROBIOTIC DAILY. AVOID ITEMS THAT TRIGGER GASTRITIS. SEE INFO BELOW.  FOLLOW A HIGH FIBER/LOW FAT DIET. AVOID ITEMS THAT CAUSE BLOATING. SEE INFO BELOW.  YOUR BIOPSY WILL BE BACK IN 14 DAYS OR YOU CAN LOOK THEM UP ON MY CHART AFTER JAN 22.  Next colonoscopy in 1-3 years.   FOLLOW UP IN MAY 2016.  ENDOSCOPY Care After Read the instructions outlined below and refer to this sheet in the next week. These discharge instructions provide you with general information on caring for yourself after you leave the hospital. While your treatment has been planned according to the most current medical practices available, unavoidable complications occasionally occur. If you have any problems or questions after discharge, call DR. Shar Paez, [phone].  ACTIVITY  You may resume your regular activity, but move at a slower pace for the next 24 hours.   Take frequent rest periods for the next 24 hours.   Walking will help get rid of the air and reduce the bloated feeling in your belly (abdomen).   No driving for 24 hours (because of the medicine (anesthesia) used during the test).   You may shower.   Do not sign any important legal documents or operate any machinery for 24 hours (because of the anesthesia  used during the test).    NUTRITION  Drink plenty of fluids.   You may resume your normal diet as instructed by your doctor.   Begin with a light meal and progress to your normal diet. Heavy or fried foods are harder to digest and may make you feel sick to your stomach (nauseated).   Avoid alcoholic beverages for 24 hours or as instructed.    MEDICATIONS  You may resume your normal medications.   WHAT YOU CAN EXPECT TODAY  Some feelings of bloating in the abdomen.   Passage of more gas than usual.   Spotting of blood in your stool or on the toilet paper  .  IF YOU HAD POLYPS REMOVED DURING THE ENDOSCOPY:  Eat a soft diet IF YOU HAVE NAUSEA, BLOATING, ABDOMINAL PAIN, OR VOMITING.    FINDING OUT THE RESULTS OF YOUR TEST Not all test results are available during your visit. DR. Oneida Alar WILL CALL YOU WITHIN 14 DAYS OF YOUR PROCEDUE WITH YOUR RESULTS. Do not assume everything is normal if you have not heard from DR. Dorsie Sethi, CALL HER OFFICE AT [phone].  SEEK IMMEDIATE MEDICAL ATTENTION AND CALL THE OFFICE: [phone] IF:  You have more than a spotting of blood in your stool.   Your belly is swollen (abdominal distention).   You are nauseated or vomiting.   You have a temperature over 101F.   You have abdominal pain or discomfort that is severe or  gets worse throughout the day.   Gastritis/DUODENITIS  Gastritis/DUODENITIS is inflammation (the body's way of reacting to injury and/or infection) of the stomach/SMALLBOWEL. It is often caused by viral or bacterial (germ) infections. It can also be caused BY ASPIRIN, BC/GOODY POWDER'S, (IBUPROFEN) MOTRIN, OR ALEVE (NAPROXEN), chemicals (including alcohol), SPICY FOODS, and medications. This illness may be associated with generalized malaise (feeling tired, not well), UPPER ABDOMINAL STOMACH cramps, and fever. One common bacterial cause of gastritis is an organism known as H. Pylori. This can be treated with antibiotics.     High-Fiber Diet A high-fiber diet changes your normal diet to include more whole grains, legumes, fruits, and vegetables. Changes in the diet involve replacing refined carbohydrates with unrefined foods. The calorie level of the diet is essentially unchanged. The Dietary Reference Intake (recommended amount) for adult males is 38 grams per day. For adult females, it is 25 grams per day. Pregnant and lactating women should consume 28 grams of fiber per day. Fiber is the intact part of a plant that is not broken down during digestion. Functional fiber is fiber that has been isolated from the plant to provide a beneficial effect in the body. PURPOSE  Increase stool bulk.   Ease and regulate bowel movements.   Lower cholesterol.  INDICATIONS THAT YOU NEED MORE FIBER  Constipation and hemorrhoids.   Uncomplicated diverticulosis (intestine condition) and irritable bowel syndrome.   Weight management.   As a protective measure against hardening of the arteries (atherosclerosis), diabetes, and cancer.   GUIDELINES FOR INCREASING FIBER IN THE DIET  Start adding fiber to the diet slowly. A gradual increase of about 5 more grams (2 slices of whole-wheat bread, 2 servings of most fruits or vegetables, or 1 bowl of high-fiber cereal) per day is best. Too rapid an increase in fiber may result in constipation, flatulence, and bloating.   Drink enough water and fluids to keep your urine clear or pale yellow. Water, juice, or caffeine-free drinks are recommended. Not drinking enough fluid may cause constipation.   Eat a variety of high-fiber foods rather than one type of fiber.   Try to increase your intake of fiber through using high-fiber foods rather than fiber pills or supplements that contain small amounts of fiber.   The goal is to change the types of food eaten. Do not supplement your present diet with high-fiber foods, but replace foods in your present diet.  INCLUDE A VARIETY OF FIBER  SOURCES  Replace refined and processed grains with whole grains, canned fruits with fresh fruits, and incorporate other fiber sources. White rice, white breads, and most bakery goods contain little or no fiber.   Brown whole-grain rice, buckwheat oats, and many fruits and vegetables are all good sources of fiber. These include: broccoli, Brussels sprouts, cabbage, cauliflower, beets, sweet potatoes, white potatoes (skin on), carrots, tomatoes, eggplant, squash, berries, fresh fruits, and dried fruits.   Cereals appear to be the richest source of fiber. Cereal fiber is found in whole grains and bran. Bran is the fiber-rich outer coat of cereal grain, which is largely removed in refining. In whole-grain cereals, the bran remains. In breakfast cereals, the largest amount of fiber is found in those with "bran" in their names. The fiber content is sometimes indicated on the label.   You may need to include additional fruits and vegetables each day.   In baking, for 1 cup white flour, you may use the following substitutions:   1 cup whole-wheat flour minus 2  tablespoons.   1/2 cup white flour plus 1/2 cup whole-wheat flour.   Low-Fat Diet BREADS, CEREALS, PASTA, RICE, DRIED PEAS, AND BEANS These products are high in carbohydrates and most are low in fat. Therefore, they can be increased in the diet as substitutes for fatty foods. They too, however, contain calories and should not be eaten in excess. Cereals can be eaten for snacks as well as for breakfast.  Include foods that contain fiber (fruits, vegetables, whole grains, and legumes). Research shows that fiber may lower blood cholesterol levels, especially the water-soluble fiber found in fruits, vegetables, oat products, and legumes. FRUITS AND VEGETABLES It is good to eat fruits and vegetables. Besides being sources of fiber, both are rich in vitamins and some minerals. They help you get the daily allowances of these nutrients. Fruits and  vegetables can be used for snacks and desserts. MEATS Limit lean meat, chicken, [place], and fish to no more than 6 ounces per day. Beef, Pork, and Lamb Use lean cuts of beef, pork, and lamb. Lean cuts include:  Extra-lean ground beef.  Arm roast.  Sirloin tip.  Center-cut ham.  Round steak.  Loin chops.  Rump roast.  Tenderloin.  Trim all fat off the outside of meats before cooking. It is not necessary to severely decrease the intake of red meat, but lean choices should be made. Lean meat is rich in protein and contains a highly absorbable form of iron. Premenopausal women, in particular, should avoid reducing lean red meat because this could increase the risk for low red blood cells (iron-deficiency anemia). The organ meats, such as liver, sweetbreads, kidneys, and brain are very rich in cholesterol. They should be limited. Chicken and [place] These are good sources of protein. The fat of poultry can be reduced by removing the skin and underlying fat layers before cooking. Chicken and [place] can be substituted for lean red meat in the diet. Poultry should not be fried or covered with high-fat sauces. Fish and Shellfish Fish is a good source of protein. Shellfish contain cholesterol, but they usually are low in saturated fatty acids. The preparation of fish is important. Like chicken and [place], they should not be fried or covered with high-fat sauces. EGGS Egg whites contain no fat or cholesterol. They can be eaten often. Try 1 to 2 egg whites instead of whole eggs in recipes or use egg substitutes that do not contain yolk. MILK AND DAIRY PRODUCTS Use skim or 1% milk instead of 2% or whole milk. Decrease whole milk, natural, and processed cheeses. Use nonfat or low-fat (2%) cottage cheese or low-fat cheeses made from vegetable oils. Choose nonfat or low-fat (1 to 2%) yogurt. Experiment with evaporated skim milk in recipes that call for heavy cream. Substitute low-fat yogurt or low-fat cottage  cheese for sour cream in dips and salad dressings. Have at least 2 servings of low-fat dairy products, such as 2 glasses of skim (or 1%) milk each day to help get your daily calcium intake.  FATS AND OILS Reduce the total intake of fats, especially saturated fat. Butterfat, lard, and beef fats are high in saturated fat and cholesterol. These should be avoided as much as possible. Vegetable fats do not contain cholesterol, but certain vegetable fats, such as coconut oil, palm oil, and palm kernel oil are very high in saturated fats. These should be limited. These fats are often used in bakery goods, processed foods, popcorn, oils, and nondairy creamers. Vegetable shortenings and some peanut butters contain hydrogenated  oils, which are also saturated fats. Read the labels on these foods and check for saturated vegetable oils. Unsaturated vegetable oils and fats do not raise blood cholesterol. However, they should be limited because they are fats and are high in calories. Total fat should still be limited to 30% of your daily caloric intake. Desirable liquid vegetable oils are corn oil, cottonseed oil, olive oil, canola oil, safflower oil, soybean oil, and sunflower oil. Peanut oil is not as good, but small amounts are acceptable. Buy a heart-healthy tub margarine that has no partially hydrogenated oils in the ingredients. Mayonnaise and salad dressings often are made from unsaturated fats, but they should also be limited because of their high calorie and fat content. Seeds, nuts, peanut butter, olives, and avocados are high in fat, but the fat is mainly the unsaturated type. These foods should be limited mainly to avoid excess calories and fat. OTHER EATING TIPS Snacks  Most sweets should be limited as snacks. They tend to be rich in calories and fats, and their caloric content outweighs their nutritional value. Some good choices in snacks are graham crackers, melba toast, soda crackers, bagels (no egg),  English muffins, fruits, and vegetables. These snacks are preferable to snack crackers, [place] fries, and chips. Popcorn should be air-popped or cooked in small amounts of liquid vegetable oil. Desserts Eat fruit, low-fat yogurt, and fruit ices. AVOID pastries, cake, and cookies. Sherbet, angel food cake, gelatin dessert, frozen low-fat yogurt, or other frozen products that do not contain saturated fat (pure fruit juice bars, frozen ice pops) are also acceptable.  COOKING METHODS Choose those methods that use little or no fat. They include: Poaching.  Braising.  Steaming.  Grilling.  Baking.  Stir-frying.  Broiling.  Microwaving.  Foods can be cooked in a nonstick pan without added fat, or use a nonfat cooking spray in regular cookware. Limit fried foods and avoid frying in saturated fat. Add moisture to lean meats by using water, broth, cooking wines, and other nonfat or low-fat sauces along with the cooking methods mentioned above. Soups and stews should be chilled after cooking. The fat that forms on top after a few hours in the refrigerator should be skimmed off. When preparing meals, avoid using excess salt. Salt can contribute to raising blood pressure in some people. EATING AWAY FROM HOME Order entres, potatoes, and vegetables without sauces or butter. When meat exceeds the size of a deck of cards (3 to 4 ounces), the rest can be taken home for another meal. Choose vegetable or fruit salads and ask for low-calorie salad dressings to be served on the side. Use dressings sparingly. Limit high-fat toppings, such as bacon, crumbled eggs, cheese, sunflower seeds, and olives. Ask for heart-healthy tub margarine instead of butter.   Diverticulosis Diverticulosis is a common condition that develops when small pouches (diverticula) form in the wall of the colon. The risk of diverticulosis increases with age. It happens more often in people who eat a low-fiber diet. Most individuals with  diverticulosis have no symptoms. Those individuals with symptoms usually experience belly (abdominal) pain, constipation, or loose stools (diarrhea).  HOME CARE INSTRUCTIONS  Increase the amount of fiber in your diet as directed by your caregiver or dietician. This may reduce symptoms of diverticulosis.   Drink at least 6 to 8 glasses of water each day to prevent constipation.   Try not to strain when you have a bowel movement.   Avoiding nuts and seeds to prevent complications is still an  uncertain benefit.       FOODS HAVING HIGH FIBER CONTENT INCLUDE:  Fruits. Apple, peach, pear, tangerine, raisins, prunes.   Vegetables. Brussels sprouts, asparagus, broccoli, cabbage, carrot, cauliflower, romaine lettuce, spinach, summer squash, tomato, winter squash, zucchini.   Starchy Vegetables. Baked beans, kidney beans, lima beans, split peas, lentils, potatoes (with skin).   Grains. Whole wheat bread, brown rice, bran flake cereal, plain oatmeal, white rice, shredded wheat, bran muffins.   Polyps, Colon  A polyp is extra tissue that grows inside your body. Colon polyps grow in the large intestine. The large intestine, also called the colon, is part of your digestive system. It is a long, hollow tube at the end of your digestive tract where your body makes and stores stool. Most polyps are not dangerous. They are benign. This means they are not cancerous. But over time, some types of polyps can turn into cancer. Polyps that are smaller than a pea are usually not harmful. But larger polyps could someday become or may already be cancerous. To be safe, doctors remove all polyps and test them.   WHO GETS POLYPS? Anyone can get polyps, but certain people are more likely than others. You may have a greater chance of getting polyps if:  You are over 50.   You have had polyps before.   Someone in your family has had polyps.   Someone in your family has had cancer of the large intestine.    Find out if someone in your family has had polyps. You may also be more likely to get polyps if you:   Eat a lot of fatty foods   Smoke   Drink alcohol   Do not exercise  Eat too much   TREATMENT  The caregiver will remove the polyp during sigmoidoscopy or colonoscopy.  PREVENTION There is not one sure way to prevent polyps. You might be able to lower your risk of getting them if you:  Eat more fruits and vegetables and less fatty food.   Do not smoke.   Avoid alcohol.   Exercise every day.   Lose weight if you are overweight.   Eating more calcium and folate can also lower your risk of getting polyps. Some foods that are rich in calcium are milk, cheese, and broccoli. Some foods that are rich in folate are chickpeas, kidney beans, and spinach. 7

## 2014-01-27 NOTE — Anesthesia Procedure Notes (Signed)
Procedure Name: MAC Date/Time: 01/27/2014 8:26 AM Performed by: Andree Elk, AMY A Pre-anesthesia Checklist: Patient identified, Timeout performed, Emergency Drugs available, Suction available and Patient being monitored Oxygen Delivery Method: Simple face mask

## 2014-01-27 NOTE — Transfer of Care (Signed)
Immediate Anesthesia Transfer of Care Note  Patient: Sarah Braun  Procedure(s) Performed: Procedure(s): COLONOSCOPY WITH PROPOFOL; IN CECUM AT 0850, out @ 0940 ; withdrawal time 50 minutes; TWO METAL CLIPS PLACED IN COLON (N/A) ESOPHAGOGASTRODUODENOSCOPY (EGD) WITH PROPOFOL; TWO METAL CLIPS PLACED IN STOMACH (N/A) POLYPECTOMY (N/A) BIOPSY (N/A)  Patient Location: PACU  Anesthesia Type:MAC  Level of Consciousness: awake, alert , oriented and patient cooperative  Airway & Oxygen Therapy: Patient Spontanous Breathing and Patient connected to face mask oxygen  Post-op Assessment: Report given to PACU RN and Post -op Vital signs reviewed and stable  Post vital signs: Reviewed and stable  Complications: No apparent anesthesia complications

## 2014-01-27 NOTE — H&P (Signed)
Primary Care Physician:  Pcp Not In System Primary Gastroenterologist:  Dr. Oneida Alar  Pre-Procedure History & Physical: HPI:  Sarah Braun is a 74 y.o. female here for ABDOMINAL PAIN/WEIGHT LOSS/DYSPEPSIA.  Past Medical History  Diagnosis Date  . Anxiety   . HTN (hypertension)   . Arthritis   . Acid reflux   . Colitis   . Diverticulosis   . IBS (irritable bowel syndrome)   . Fibromyalgia   . Insomnia   . Pancreatic cyst   . COPD (chronic obstructive pulmonary disease)   . Palpitations   . Aneurysm   . Hiatal hernia   . Hemorrhoids   . Depression   . Hypothyroidism   . Hypercholesteremia     Past Surgical History  Procedure Laterality Date  . Colonoscopy  2012  . Esophagogastroduodenoscopy endoscopy  2012  . Abdominal hysterectomy    . Cholecystectomy    . Appendectomy      Prior to Admission medications   Medication Sig Start Date End Date Taking? Authorizing Provider  ALPRAZolam Duanne Moron) 1 MG tablet Take 1 mg by mouth 2 (two) times daily as needed.  01/03/14  Yes Historical Provider, MD  CALCIUM 500/D 500-200 MG-UNIT per tablet Take 1 tablet by mouth 2 (two) times daily.  12/17/13  Yes Historical Provider, MD  Cholecalciferol (VITAMIN D3) 2000 UNITS TABS Take 2,000 Units by mouth 2 (two) times daily.   Yes Historical Provider, MD  conjugated estrogens (PREMARIN) vaginal cream Place 1 Applicatorful vaginally once a week.    Yes Historical Provider, MD  dexlansoprazole (DEXILANT) 60 MG capsule Take 60 mg by mouth daily.   Yes Historical Provider, MD  DULoxetine (CYMBALTA) 60 MG capsule Take 60 mg by mouth daily.  10/17/13  Yes Historical Provider, MD  FIBER PO Take 1 tablet by mouth daily.    Yes Historical Provider, MD  HYDROcodone-acetaminophen (NORCO/VICODIN) 5-325 MG per tablet Take 1 tablet by mouth every 4 (four) hours as needed.  10/22/13  Yes Historical Provider, MD  ipratropium-albuterol (DUONEB) 0.5-2.5 (3) MG/3ML SOLN Take 3 mLs by nebulization.   Yes  Historical Provider, MD  levothyroxine (SYNTHROID, LEVOTHROID) 50 MCG tablet Take 100 mcg by mouth daily before breakfast.  10/14/13  Yes Historical Provider, MD  lisinopril (PRINIVIL,ZESTRIL) 20 MG tablet Take 20 mg by mouth daily.  12/19/13  Yes Historical Provider, MD  magnesium hydroxide (MILK OF MAGNESIA) 400 MG/5ML suspension Take 5 mLs by mouth daily as needed for mild constipation.   Yes Historical Provider, MD  Multiple Vitamin (MULTIVITAMIN) capsule Take 1 capsule by mouth daily.   Yes Historical Provider, MD  Na Sulfate-K Sulfate-Mg Sulf (SUPREP BOWEL PREP) SOLN Take 1 kit by mouth as directed. 01/22/14  Yes Danie Binder, MD  Omega-3 Fatty Acids (OMEGA-3 FISH OIL) 1200 MG CAPS Take 1,200 mg by mouth daily.   Yes Historical Provider, MD  omeprazole (PRILOSEC) 40 MG capsule Take 40 mg by mouth 2 (two) times daily.   Yes Historical Provider, MD  Probiotic Product (PROBIOTIC DAILY PO) Take by mouth daily.   Yes Historical Provider, MD  tetrahydrozoline-zinc (VISINE-AC) 0.05-0.25 % ophthalmic solution 2 drops as needed.   Yes Historical Provider, MD  aspirin 81 MG tablet Take 81 mg by mouth daily.    Historical Provider, MD  dicyclomine (BENTYL) 20 MG tablet Take 20 mg by mouth as needed.  12/24/13   Historical Provider, MD  docusate sodium (COLACE) 100 MG capsule Take 100 mg by mouth daily as needed for  mild constipation.    Historical Provider, MD  peg 3350 powder (MOVIPREP) 100 G SOLR Take 1 kit (200 g total) by mouth once. 01/19/14 02/18/14  Ranae Pila, NP    Allergies as of 01/19/2014 - Review Complete 01/19/2014  Allergen Reaction Noted  . Advil [ibuprofen]  01/19/2014  . Ciprofloxacin  01/19/2014  . Codeine  01/19/2014  . Metronidazole  01/19/2014  . Sulfa antibiotics  01/19/2014    Family History  Problem Relation Age of Onset  . Colon cancer Neg Hx     History   Social History  . Marital Status: Divorced    Spouse Name: N/A    Number of Children: N/A  . Years  of Education: N/A   Occupational History  . retired    Social History Main Topics  . Smoking status: Current Every Day Smoker -- 0.50 packs/day for 64 years    Types: Cigarettes  . Smokeless tobacco: Never Used  . Alcohol Use: No  . Drug Use: No  . Sexual Activity: Yes    Birth Control/ Protection: Surgical   Other Topics Concern  . Not on file   Social History Narrative    Review of Systems: See HPI, otherwise negative ROS   Physical Exam: BP 131/75 mmHg  Pulse 82  Temp(Src) 97.6 F (36.4 C) (Oral)  Resp 14  Ht 5' (1.524 m)  Wt 92 lb (41.731 kg)  BMI 17.97 kg/m2  SpO2 100% General:   Alert,  pleasant and cooperative in NAD Head:  Normocephalic and atraumatic. Neck:  Supple; Lungs:  Clear throughout to auscultation.    Heart:  Regular rate and rhythm. Abdomen:  Soft, nontender and nondistended. Normal bowel sounds, without guarding, and without rebound.   Neurologic:  Alert and  oriented x4;  grossly normal neurologically.  Impression/Plan:    ABDOMINAL PAIN/WEIGHT LOSS/DYSPEPSIA  PLAN: EGD/TCS TODAY

## 2014-01-28 ENCOUNTER — Telehealth: Payer: Self-pay | Admitting: Gastroenterology

## 2014-01-28 MED ORDER — PANTOPRAZOLE SODIUM 40 MG PO TBEC: DELAYED_RELEASE_TABLET | ORAL | Status: AC

## 2014-01-28 NOTE — Telephone Encounter (Signed)
I called pt and she said the Dexilant is $80.00. No Rx sent in for the [place]. She cannot afford the Dexilant.  She is having some stomach cramps since the procedure and I told her that is not uncommon.  I advised her to eat lightly for a day or so.  She is aware that she can resume her regular meds, except she was told to hold the ASA for a few days.

## 2014-01-28 NOTE — Telephone Encounter (Signed)
PATIENT CALLED ASKING ABOUT TAKING MEDICATIONS AFTER HER UPPER ENDO FROM YESTERDAY.  WANTS TO KNOW IF SHE CAN GO BACK TO TAKING HER MIRALAX.  ALSO CAN NOT AFFORD [place].  ALSO HAVING STOMACH PAIN AND CRAMPS   PLEASE ADVISE

## 2014-01-28 NOTE — Addendum Note (Signed)
Addended by: Danie Binder on: 01/28/2014 05:14 PM   Modules accepted: Orders, Medications

## 2014-01-28 NOTE — Telephone Encounter (Signed)
PLEASE CALL PT. NO RX FOR LINZESS WAS SENT BECAUSE SHE SAID SHE HAD SAMPLES AND SHE WAS SUPPOSE TO CALL us BACK TO GIVE [place] THE DOSE SHE IS TAKING. IF DEXILANT IS TOO EXPENSIVE, I WILL SEND AN ALTERNATIVE RX: PROTONIX 30 MINS BEFORE MEALS BID. HER STOMACH MAY HURTS DUE TO THE SUPERFICIAL TEAR INHER STOMACH. SHE SHOULD FOLLOW A SOFT BLAND DIET FOR 7 DAYS. THE SHE MAY RESUME HER LOW FAT DIET.

## 2014-01-29 ENCOUNTER — Encounter (HOSPITAL_COMMUNITY): Payer: Self-pay | Admitting: Gastroenterology

## 2014-01-29 NOTE — Telephone Encounter (Signed)
REVIEWED-NO ADDITIONAL RECOMMENDATIONS. 

## 2014-01-29 NOTE — Telephone Encounter (Signed)
I called and informed pt. She will let us know later about the Linzess. She was still in bed, had a bad night not feeling well and with her abdominal pain. She will try the soft bland diet and let us know if she has other problems.

## 2014-02-04 NOTE — Telephone Encounter (Signed)
Pt called and said she needs something in place of Linzess for constipation, she cannot afford Linzess.   She said she did not remember that the Protonix had been sent to the pharmacy she will check on that.  She just has no appetite, trying to eat healthy foods but said she is losing weight and she is worried about herself. ( Down to 90 lbs now she said).   Please advise!

## 2014-02-05 ENCOUNTER — Telehealth: Payer: Self-pay | Admitting: Nurse Practitioner

## 2014-02-05 NOTE — Telephone Encounter (Signed)
Opened in error

## 2014-02-06 MED ORDER — LUBIPROSTONE 8 MCG PO CAPS: ORAL_CAPSULE | ORAL | Status: AC

## 2014-02-06 NOTE — Addendum Note (Signed)
Addended by: Danie Binder on: 02/06/2014 10:33 AM   Modules accepted: Orders

## 2014-02-06 NOTE — Telephone Encounter (Addendum)
Please call pt. She had simple adenomas removed from her colon. HER stomach Bx shows mild gastritis.  HER SMALL BOWEL BIOPSIES ARE NORMAL. SHE HAS HAD A CT THAT SHOWS NO SERIOUS PATHOLOGY IN HER ABDOMEN. NOTHING IN HER GI TRACT IS CAUSING HER TO LOSE WEIGHT.    SHE CAN TRY AMITIZA FOR HER CONSTIPATION. IT MAY CAUSE NAUSEA. SHE SHOULD PICKUP A CO-PAY CARD. CALL IN ONE MONTH IF HER CONSTIPATION IS NOT BETTER. STOP TAKING BENTYL IF SHE IS HAVING CONSTIPATION. DRINK WATER TO KEEP YOUR URINE LIGHT YELLOW. FOLLOW A HIGH FIBER DIET.  USE CARNATION INSTANT BREAKFAST, BOOST, OR ENSURE FOUR TIMES A DAY TO GAIN WEIGHT. SHE NEEDS TO SEE HER PRIMARY DOCTOR, ENT DOCTOR, OR NEUROLOGY DOCTOR FOR HER WEIGHT LOSS. CONTINUE THE PROBIOTIC DAILY. NEXT COLONOSCOPY IN 3 YEARS WITH AN OVERTUBE AND PROPOFOL. FOLLOW UP IN 4 MOS E30 CONSTIPATION, ABDOMINAL PAIN.

## 2014-02-06 NOTE — Telephone Encounter (Signed)
LM for pt to call

## 2014-02-06 NOTE — Telephone Encounter (Signed)
ON RECALL FOR TCS AND OV

## 2014-02-09 NOTE — Telephone Encounter (Signed)
Pt is aware of results. 

## 2014-02-09 NOTE — Progress Notes (Signed)
Quick Note:  Pt is aware of results. ______ 

## 2014-05-06 ENCOUNTER — Encounter: Payer: Self-pay | Admitting: Gastroenterology

## 2014-05-18 NOTE — Progress Notes (Signed)
REVIEWED. She had simple adenomas removed from her colon. HER stomach Bx shows mild gastritis. HER SMALL BOWEL BIOPSIES ARE NORMAL. SHE HAS HAD A CT THAT SHOWS NO SERIOUS PATHOLOGY IN HER ABDOMEN. NOTHING IN HER GI TRACT IS CAUSING HER TO LOSE WEIGHT.

## 2015-10-08 ENCOUNTER — Other Ambulatory Visit: Payer: Self-pay | Admitting: Vascular Surgery

## 2015-10-08 DIAGNOSIS — I739 Peripheral vascular disease, unspecified: Secondary | ICD-10-CM

## 2015-11-02 ENCOUNTER — Encounter: Payer: Self-pay | Admitting: Vascular Surgery

## 2015-11-05 ENCOUNTER — Encounter (HOSPITAL_COMMUNITY): Payer: PRIVATE HEALTH INSURANCE

## 2015-11-05 ENCOUNTER — Encounter: Payer: PRIVATE HEALTH INSURANCE | Admitting: Vascular Surgery

## 2015-11-07 ENCOUNTER — Encounter (HOSPITAL_COMMUNITY): Payer: Self-pay

## 2015-11-07 ENCOUNTER — Emergency Department (HOSPITAL_COMMUNITY)
Admission: EM | Admit: 2015-11-07 | Discharge: 2015-11-07 | Disposition: A | Discharge status: Home / Self Care | Payer: Medicare Other | Attending: Emergency Medicine | Admitting: Emergency Medicine

## 2015-11-07 DIAGNOSIS — I10 Essential (primary) hypertension: Secondary | ICD-10-CM | POA: Diagnosis not present

## 2015-11-07 DIAGNOSIS — G8929 Other chronic pain: Secondary | ICD-10-CM | POA: Insufficient documentation

## 2015-11-07 DIAGNOSIS — J449 Chronic obstructive pulmonary disease, unspecified: Secondary | ICD-10-CM | POA: Insufficient documentation

## 2015-11-07 DIAGNOSIS — E039 Hypothyroidism, unspecified: Secondary | ICD-10-CM | POA: Insufficient documentation

## 2015-11-07 DIAGNOSIS — R5382 Chronic fatigue, unspecified: Secondary | ICD-10-CM

## 2015-11-07 DIAGNOSIS — R634 Abnormal weight loss: Secondary | ICD-10-CM

## 2015-11-07 DIAGNOSIS — R1310 Dysphagia, unspecified: Secondary | ICD-10-CM | POA: Insufficient documentation

## 2015-11-07 DIAGNOSIS — F17210 Nicotine dependence, cigarettes, uncomplicated: Secondary | ICD-10-CM | POA: Insufficient documentation

## 2015-11-07 DIAGNOSIS — R109 Unspecified abdominal pain: Secondary | ICD-10-CM | POA: Insufficient documentation

## 2015-11-07 LAB — CBC WITH DIFFERENTIAL/PLATELET
Basophils Absolute: 0 10*3/uL (ref 0.0–0.1)
Basophils Relative: 0 %
Eosinophils Absolute: 0.1 10*3/uL (ref 0.0–0.7)
Eosinophils Relative: 1 %
HCT: 42.9 % (ref 36.0–46.0)
HEMOGLOBIN: 14 g/dL (ref 12.0–15.0)
Lymphocytes Relative: 37 %
Lymphs Abs: 2.3 10*3/uL (ref 0.7–4.0)
MCH: 29.5 pg (ref 26.0–34.0)
MCHC: 32.6 g/dL (ref 30.0–36.0)
MCV: 90.5 fL (ref 78.0–100.0)
Monocytes Absolute: 0.6 10*3/uL (ref 0.1–1.0)
Monocytes Relative: 9 %
NEUTROS PCT: 53 %
Neutro Abs: 3.3 10*3/uL (ref 1.7–7.7)
Platelets: 187 10*3/uL (ref 150–400)
RBC: 4.74 MIL/uL (ref 3.87–5.11)
RDW: 12.8 % (ref 11.5–15.5)
WBC: 6.3 10*3/uL (ref 4.0–10.5)

## 2015-11-07 LAB — URINALYSIS, ROUTINE W REFLEX MICROSCOPIC
Bilirubin Urine: NEGATIVE
Glucose, UA: NEGATIVE mg/dL
Hgb urine dipstick: NEGATIVE
Ketones, ur: NEGATIVE mg/dL
Leukocytes, UA: NEGATIVE
NITRITE: NEGATIVE
PROTEIN: NEGATIVE mg/dL
SPECIFIC GRAVITY, URINE: 1.004 — AB (ref 1.005–1.030)
pH: 7 (ref 5.0–8.0)

## 2015-11-07 LAB — COMPREHENSIVE METABOLIC PANEL
ALK PHOS: 73 U/L (ref 38–126)
ALT: 17 U/L (ref 14–54)
AST: 24 U/L (ref 15–41)
Albumin: 3.5 g/dL (ref 3.5–5.0)
Anion gap: 6 (ref 5–15)
BUN: 12 mg/dL (ref 6–20)
CALCIUM: 8.8 mg/dL — AB (ref 8.9–10.3)
CO2: 27 mmol/L (ref 22–32)
Chloride: 103 mmol/L (ref 101–111)
Creatinine, Ser: 0.91 mg/dL (ref 0.44–1.00)
Glucose, Bld: 84 mg/dL (ref 65–99)
Potassium: 4.9 mmol/L (ref 3.5–5.1)
SODIUM: 136 mmol/L (ref 135–145)
Total Bilirubin: 0.4 mg/dL (ref 0.3–1.2)
Total Protein: 6.4 g/dL — ABNORMAL LOW (ref 6.5–8.1)

## 2015-11-07 MED ORDER — ONDANSETRON 4 MG PO TBDP
8.0000 mg | ORAL_TABLET | Freq: Once | ORAL | Status: AC
  Administered 2015-11-07: 8 mg via ORAL
  Filled 2015-11-07: qty 2

## 2015-11-07 MED ORDER — SODIUM CHLORIDE 0.9 % IV BOLUS (SEPSIS)
1000.0000 mL | Freq: Once | INTRAVENOUS | Status: AC
  Administered 2015-11-07: 1000 mL via INTRAVENOUS

## 2015-11-07 MED ORDER — TRAMADOL HCL 50 MG PO TABS
50.0000 mg | ORAL_TABLET | Freq: Once | ORAL | Status: AC
  Administered 2015-11-07: 50 mg via ORAL
  Filled 2015-11-07: qty 1

## 2015-11-07 NOTE — Discharge Instructions (Signed)
Please follow up with Dr. Bartolo Darter tomorrow. They will see you tomorrow in the office

## 2015-11-07 NOTE — ED Triage Notes (Signed)
Patient here with multiple complaints. States she has ben seen by multiple doctors for drainage from her nose and throat and lose of appetite and weight for months. Also complains of chronic leg pain and was to have doppler study but missed appointment. Patient alert and oriented, NAD

## 2015-11-07 NOTE — ED Provider Notes (Signed)
[facility] DEPT Provider Note   CSN: [place]:[id number] Arrival date & time: 11/07/15  1142     History   Chief Complaint Chief Complaint  Patient presents with  . multiple complaints    HPI Sarah Braun is a 75 y.o. female with complicated medical history presents with multiple complaints. PMH significant for severe uncontrolled GERD with gastritis, glossitis, unintentional weight loss, hx of gastric polyps, hx of benign pancreatic cyst, chronic abdominal pain, anxiety/depression. Patient states that chronically she is lightheaded, fatigued, weak, and has decreased PO intake with unintentional weight loss. She has also had "white drainage" from her mouth which she swallows and then spits up. She reports burning sensation in her mouth. She has been told that this is from her sinuses. She brought a sample today to be tested. She has had difficulty swallowing and painful swallowing for years. She also has abdominal pain which is across her lower abdomen. She had an EGD done on 10/20 with biopsies to exclude Celiac disease. Results are pending from that. EGD before that was in Jan 2016 which did not show any obvious source of N/V and weight loss. She had a colonoscopy which showed polpys as well. She reports decreased taste and hypersensitivity to smells which causes her to have a decreased appetite. She is s/p cholecystectomy, partial hysterectomy. Has had extensive evaluations by ENT and GI. Denies fever, chills, sweats, vomiting, diarrhea/constipation. She is here today because her daughter told her to come. Daughter called PCP's office and told them she was altered therefore they told her to come to the ED. Patient states she is too weak to go to any more doctor appointments as outpatient. She takes she is taking her medicines as prescribed without relief. She states she wants to come to an "exploratory hospital" like the [facility] where they will figure out what is wrong with  her.   HPI  Past Medical History:  Diagnosis Date  . Acid reflux   . Aneurysm ([place])   . Anxiety   . Arthritis   . Colitis   . COPD (chronic obstructive pulmonary disease) ([place])   . Depression   . Diverticulosis   . Fibromyalgia   . Hemorrhoids   . Hiatal hernia   . HTN (hypertension)   . Hypercholesteremia   . Hypothyroidism   . IBS (irritable bowel syndrome)   . Insomnia   . Palpitations   . Pancreatic cyst     Patient Active Problem List   Diagnosis Date Noted  . GERD (gastroesophageal reflux disease) 01/19/2014  . Unintentional weight loss 01/19/2014  . IBS (irritable bowel syndrome) 01/19/2014  . Pancreatic cyst 01/19/2014  . Abdominal pain 01/19/2014    Past Surgical History:  Procedure Laterality Date  . ABDOMINAL HYSTERECTOMY    . APPENDECTOMY    . BIOPSY N/A 01/27/2014   Procedure: BIOPSY;  Surgeon: Danie Binder, MD;  Location: [facility] ORS;  Service: Endoscopy;  Laterality: N/A;  . CHOLECYSTECTOMY    . COLONOSCOPY  2012  . COLONOSCOPY WITH PROPOFOL N/A 01/27/2014   Procedure: COLONOSCOPY WITH PROPOFOL; IN CECUM AT 0850, out @ [id number] ; withdrawal time 50 minutes; TWO METAL CLIPS PLACED IN COLON;  Surgeon: Danie Binder, MD;  Location: [facility];  Service: Endoscopy;  Laterality: N/A;  . ESOPHAGOGASTRODUODENOSCOPY (EGD) WITH PROPOFOL N/A 01/27/2014   Procedure: ESOPHAGOGASTRODUODENOSCOPY (EGD) WITH PROPOFOL; TWO METAL CLIPS PLACED IN STOMACH;  Surgeon: Danie Binder, MD;  Location: [facility];  Service: Endoscopy;  Laterality: N/A;  .  ESOPHAGOGASTRODUODENOSCOPY ENDOSCOPY  2012  . POLYPECTOMY N/A 01/27/2014   Procedure: POLYPECTOMY;  Surgeon: Danie Binder, MD;  Location: [facility];  Service: Endoscopy;  Laterality: N/A;    OB History    No data available       Home Medications    Prior to Admission medications   Medication Sig Start Date End Date Taking? Authorizing Provider  ALPRAZolam Duanne Moron) 1 MG tablet Take 1 mg by mouth 2 (two) times daily as needed.   01/03/14   Historical Provider, MD  CALCIUM 500/D 500-200 MG-UNIT per tablet Take 1 tablet by mouth 2 (two) times daily.  12/17/13   Historical Provider, MD  Cholecalciferol (VITAMIN D3) 2000 UNITS TABS Take 2,000 Units by mouth 2 (two) times daily.    Historical Provider, MD  conjugated estrogens (PREMARIN) vaginal cream Place 1 Applicatorful vaginally once a week.     Historical Provider, MD  docusate sodium (COLACE) 100 MG capsule Take 100 mg by mouth daily as needed for mild constipation.    Historical Provider, MD  DULoxetine (CYMBALTA) 60 MG capsule Take 60 mg by mouth daily.  10/17/13   Historical Provider, MD  FIBER PO Take 1 tablet by mouth daily.     Historical Provider, MD  HYDROcodone-acetaminophen (NORCO/VICODIN) 5-325 MG per tablet Take 1 tablet by mouth every 4 (four) hours as needed.  10/22/13   Historical Provider, MD  ipratropium-albuterol (DUONEB) 0.5-2.5 (3) MG/3ML SOLN Take 3 mLs by nebulization.    Historical Provider, MD  levothyroxine (SYNTHROID, LEVOTHROID) 50 MCG tablet Take 100 mcg by mouth daily before breakfast.  10/14/13   Historical Provider, MD  lisinopril (PRINIVIL,ZESTRIL) 20 MG tablet Take 20 mg by mouth daily.  12/19/13   Historical Provider, MD  lubiprostone (AMITIZA) 8 MCG capsule 1 PO QHS FOR 7 DAYS THEN 1 PO BID 02/06/14   Danie Binder, MD  magnesium hydroxide (MILK OF MAGNESIA) 400 MG/5ML suspension Take 5 mLs by mouth daily as needed for mild constipation.    Historical Provider, MD  Multiple Vitamin (MULTIVITAMIN) capsule Take 1 capsule by mouth daily.    Historical Provider, MD  Omega-3 Fatty Acids (OMEGA-3 FISH OIL) 1200 MG CAPS Take 1,200 mg by mouth daily.    Historical Provider, MD  pantoprazole (PROTONIX) 40 MG tablet 1 PO 30 MINUTES PRIOR TO MEALS BID FOR 3 MOS THEN QD 01/28/14   Danie Binder, MD  Probiotic Product (PROBIOTIC DAILY PO) Take by mouth daily.    Historical Provider, MD  tetrahydrozoline-zinc (VISINE-AC) 0.05-0.25 % ophthalmic solution 2  drops as needed.    Historical Provider, MD    Family History Family History  Problem Relation Age of Onset  . Colon cancer Neg Hx     Social History Social History  Substance Use Topics  . Smoking status: Current Every Day Smoker    Packs/day: 0.50    Years: 64.00    Types: Cigarettes  . Smokeless tobacco: Never Used  . Alcohol use No     Allergies   Advil [ibuprofen]; Ciprofloxacin; Codeine; Metronidazole; and Sulfa antibiotics   Review of Systems Review of Systems  Constitutional: Positive for appetite change, fatigue and unexpected weight change. Negative for fever.  HENT: Positive for trouble swallowing. Negative for postnasal drip and sore throat.        Milky white drainage from mouth  Respiratory: Negative for shortness of breath.   Cardiovascular: Negative for chest pain.  Gastrointestinal: Positive for abdominal pain and nausea. Negative for constipation, diarrhea  and vomiting.  Neurological: Positive for weakness and light-headedness. Negative for syncope.  All other systems reviewed and are negative.    Physical Exam Updated Vital Signs BP 134/59 (BP Location: Left Arm)   Pulse 85   Temp 97.6 F (36.4 C) (Oral)   Resp 16   SpO2 97%   Physical Exam  Constitutional: She is oriented to person, place, and time. She appears cachectic. No distress.  Chronically ill appearing with marked muscle wasting  HENT:  Head: Normocephalic and atraumatic.  Right Ear: Hearing, tympanic membrane, external ear and ear canal normal.  Left Ear: Hearing, tympanic membrane, external ear and ear canal normal.  Mouth/Throat: Uvula is midline. Mucous membranes are dry. She has dentures. No oral lesions. Posterior oropharyngeal erythema present. No oropharyngeal exudate or posterior oropharyngeal edema.  Eyes: Conjunctivae are normal. Pupils are equal, round, and reactive to light. Right eye exhibits no discharge. Left eye exhibits no discharge. No scleral icterus.  Neck:  Normal range of motion. Neck supple.  Cardiovascular: Normal rate and regular rhythm.  Exam reveals no gallop and no friction rub.   No murmur heard. Pulmonary/Chest: Effort normal and breath sounds normal. No respiratory distress. She has no wheezes. She has no rales. She exhibits no tenderness.  Abdominal: Soft. Bowel sounds are normal. She exhibits no distension and no mass. There is tenderness. There is no rebound and no guarding. No hernia.  Epigastric tenderness, diffuse lower abdominal tenderness  Musculoskeletal: She exhibits no edema.  No swelling of calves. Calves are tender to palpation bilaterally  Neurological: She is alert and oriented to person, place, and time.  Skin: Skin is warm and dry. She is not diaphoretic.  Psychiatric: She has a normal mood and affect. Her behavior is normal.  Nursing note and vitals reviewed.    ED Treatments / Results  Labs (all labs ordered are listed, but only abnormal results are displayed) Labs Reviewed  COMPREHENSIVE METABOLIC PANEL - Abnormal; Notable for the following:       Result Value   Calcium 8.8 (*)    Total Protein 6.4 (*)    All other components within normal limits  URINALYSIS, ROUTINE W REFLEX MICROSCOPIC (NOT AT [facility]) - Abnormal; Notable for the following:    Specific Gravity, Urine 1.004 (*)    All other components within normal limits  CBC WITH DIFFERENTIAL/PLATELET    EKG  EKG Interpretation None       Radiology No results found.  Procedures Procedures (including critical care time)  Medications Ordered in ED Medications  sodium chloride 0.9 % bolus 1,000 mL (0 mLs Intravenous Stopped 11/07/15 1711)  traMADol (ULTRAM) tablet 50 mg (50 mg Oral Given 11/07/15 1535)  ondansetron (ZOFRAN-ODT) disintegrating tablet 8 mg (8 mg Oral Given 11/07/15 1535)    Initial Impression / Assessment and Plan / ED Course  I have reviewed the triage vital signs and the nursing notes.  Pertinent labs & imaging results that  were available during my care of the patient were reviewed by me and considered in my medical decision making (see chart for details).  Clinical Course   75 year old female presents with chronic dysphagia and abdominal pain with associated unintentional weight loss. Patient is not altered on exam. There are not acute findings. Patient is afebrile, not tachycardic or tachypneic, normotensive, and not hypoxic. CBC unremarkable. CMP remarkable for hypocalcemia and low total protein most likely from malnutrition. UA is clean. Supportive care with IVF, Zofran, and Tramadol given.  Spoke with oncall NP with patient's PCP Dr. Bartolo Darter who stated she did speak with daughter earlier today but was told that patient felt more fatigued than usual and was altered therefore told her to come to the ED. She states that if we do not find a reason to admit that she will arrange for a follow up appointment tomorrow.  Dicussed results with patient. She is very frustrated with her slow decline and that no one will tell her what is wrong with her. Provided support and advised close follow up with PCP. Patient is NAD, non-toxic, with stable VS. Patient is informed of clinical course, understands medical decision making process, and agrees with plan. Opportunity for questions provided and all questions answered. Return precautions given.   Final Clinical Impressions(s) / ED Diagnoses   Final diagnoses:  Unintentional weight loss  Chronic fatigue  Chronic abdominal pain  Dysphagia, unspecified type    New Prescriptions Discharge Medication List as of 11/07/2015  5:13 PM       Recardo Evangelist, PA-C 11/08/15 1831    Sherwood Gambler, MD 11/17/15 0101

## 2015-12-14 IMAGING — CT CT ABD-PELV W/ CM
2 of 5 series · 15 of 46 positions shown, 17 images · IV contrast (omnipaque)
Comparison: None.

CLINICAL DATA: Unintentional weight loss. Generalized abdominal
pain. History of pancreatic cyst.

EXAM:
CT ABDOMEN AND PELVIS WITH CONTRAST
TECHNIQUE: Multidetector CT imaging of the abdomen and pelvis was performed
using the standard protocol following bolus administration of
intravenous contrast.
CONTRAST:  100mL OMNIPAQUE IOHEXOL 300 MG/ML  SOLN

[Series 2: abd_pel_with 5.0 b40f · axial · 0.59mm/px · z∈[+654,+990]mm · 12 of 75 slices shown, 14 images]
[im 4/75  soft-tissue]
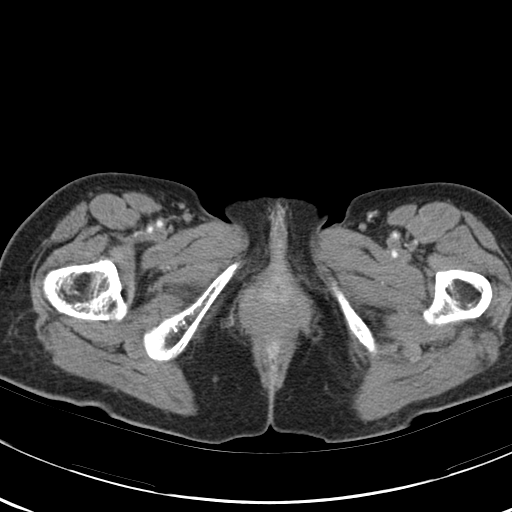
[im 4/75  bone]
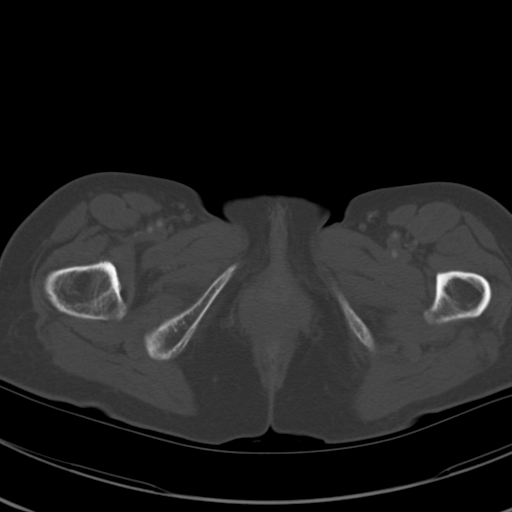
[im 12/75  soft-tissue]
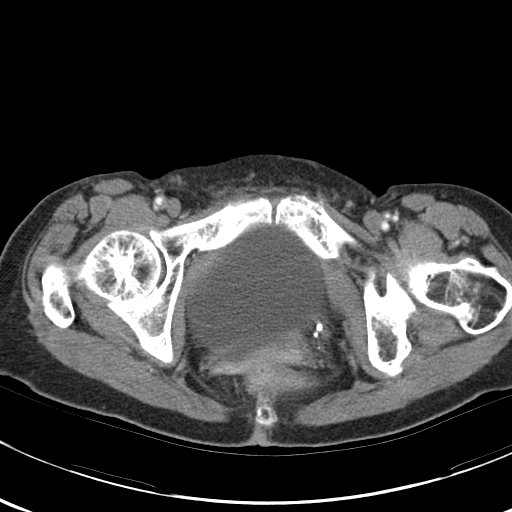
[im 15/75  soft-tissue]
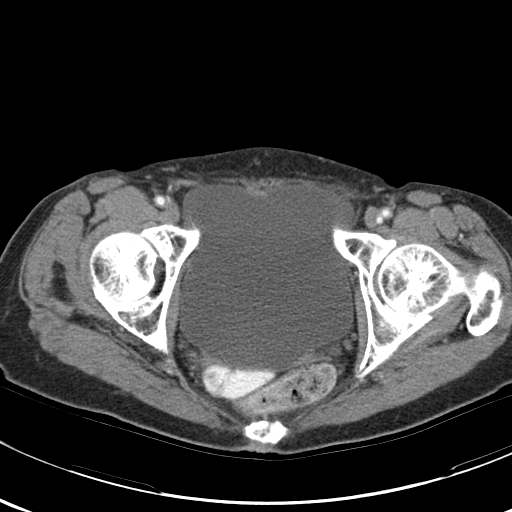
[im 23/75  soft-tissue]
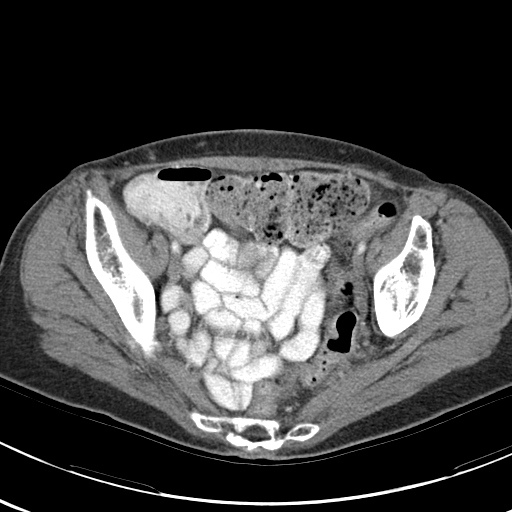
[im 30/75  soft-tissue]
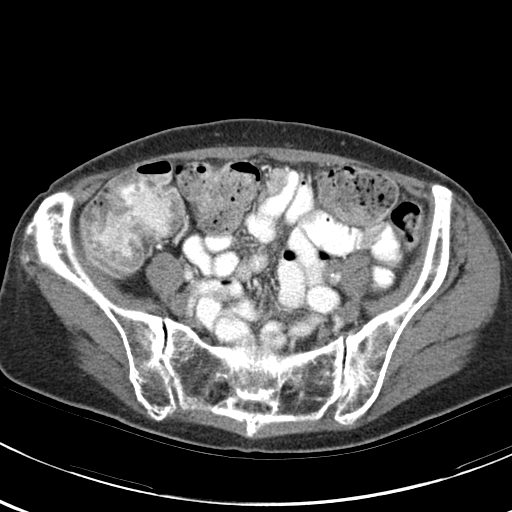
[im 34/75  soft-tissue]
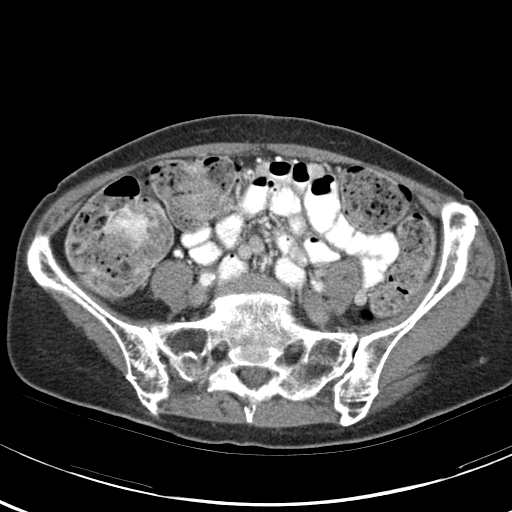
[im 41/75  soft-tissue]
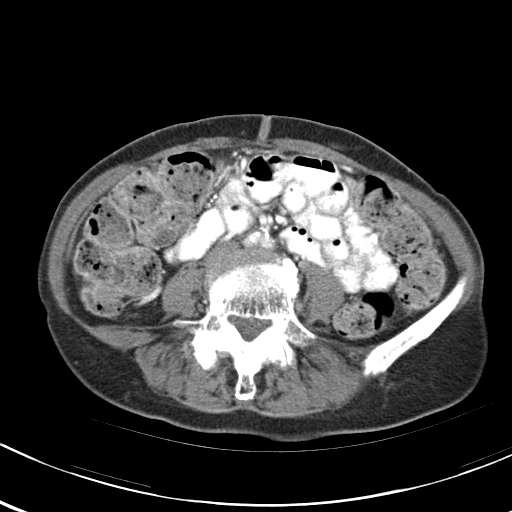
[im 45/75  soft-tissue]
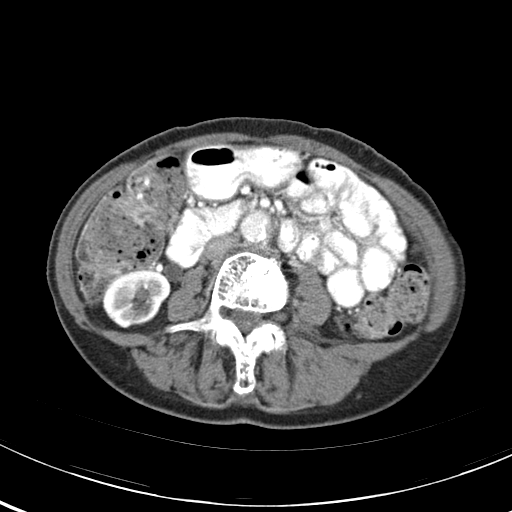
[im 52/75  soft-tissue]
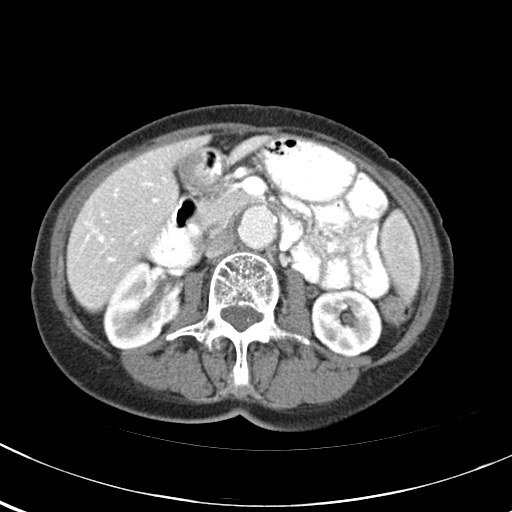
[im 52/75  bone]
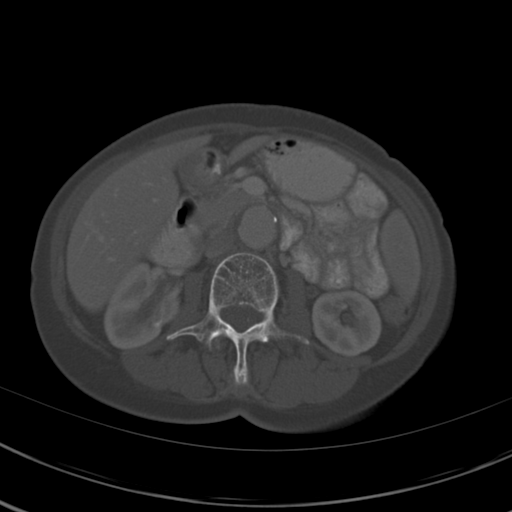
[im 60/75  soft-tissue]
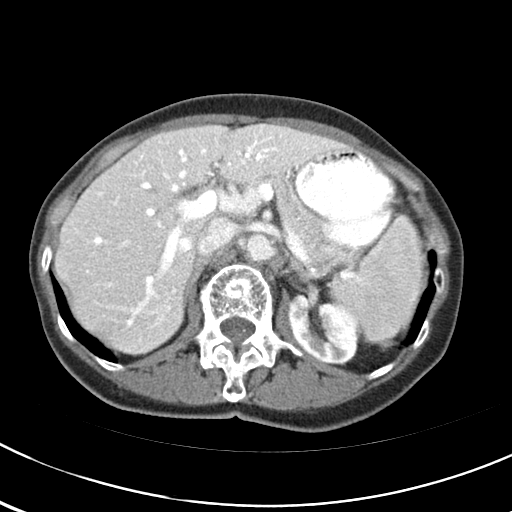
[im 63/75  soft-tissue]
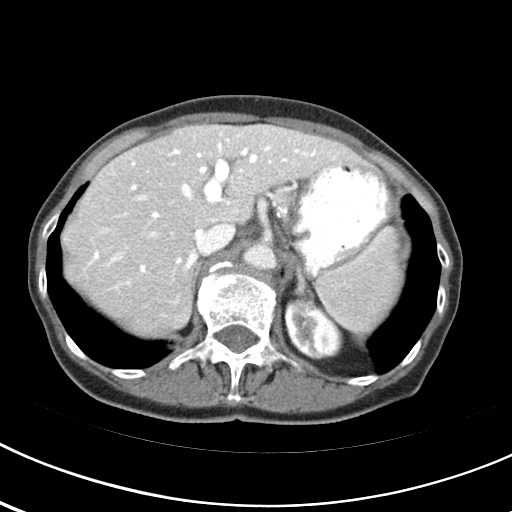
[im 71/75  soft-tissue]
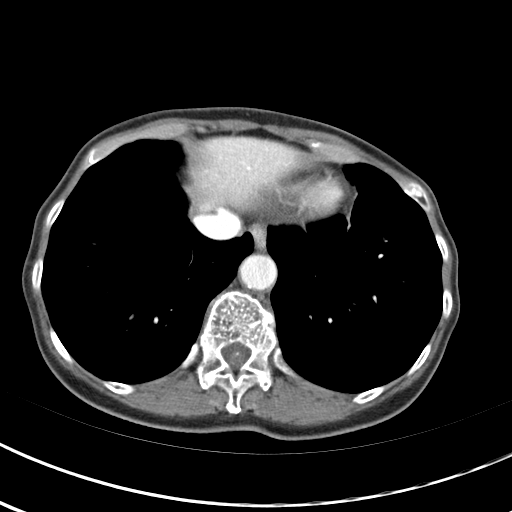

[Series 3: abd_pel_with 3.0 spo cor · coronal · 0.56mm/px · 3 of 70 slices shown]
[im 24/70  soft-tissue]
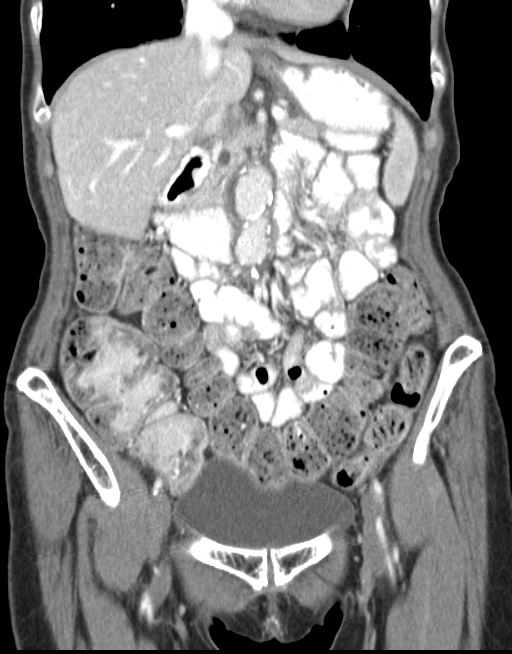
[im 31/70  soft-tissue]
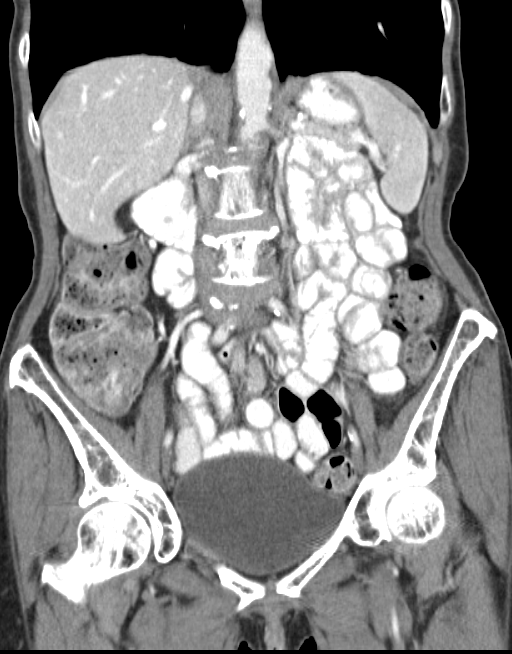
[im 39/70  soft-tissue]
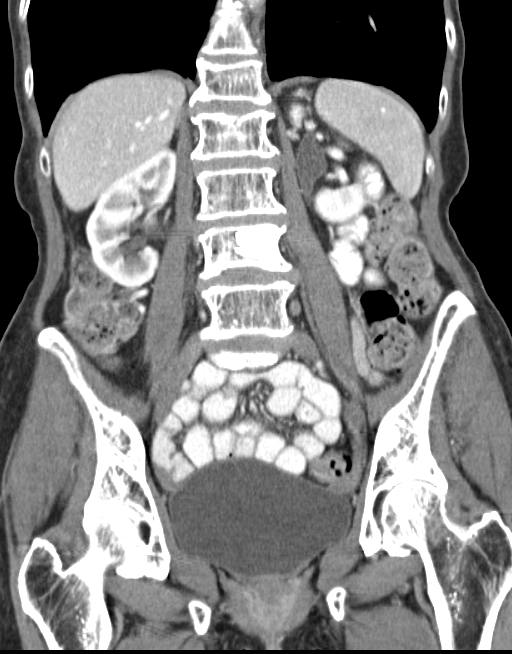

[15 of 46 positions shown; findings below may reference images not displayed]

FINDINGS: The lung bases are clear without focal nodule, mass, or airspace
disease. The heart size is normal. No significant pleural or
pericardial effusions are present.

The liver is within normal limits. Spleen is of normal size.
Punctate calcifications are noted. There is no mass lesion. The
stomach and duodenum are within normal limits. A well-defined 11 mm
low-density cystic lesion is noted near the tail of the pancreas.
There is no associated soft tissue mass. The remainder of the
pancreas is unremarkable. Common bile duct measures 5.0 mm. This is
within normal limits following cholecystectomy. The adrenal glands
are normal bilaterally. An 8 mm parenchymal calcification is noted
along the superior aspect of the right kidney. An extra renal pelvis
is noted in the low collecting system bilaterally. There is no
obstruction. The ureters are otherwise within normal limits.
Atherosclerotic changes are noted throughout the abdominal aorta.
Ectasia of the infrarenal abdominal aorta measures up to 25 mm in AP
diameter. No aneurysm is present.

The urinary bladder is somewhat full. No mass lesion is present. The
patient is status post hysterectomy. The rectosigmoid colon is
within normal limits. Moderate stool is present throughout the
remainder of the colon. No obstructing mass lesion is evident. The
small bowel is unremarkable. There is no significant adenopathy or
free fluid. The patient is status post hysterectomy. The ovaries are
potentially visualized and within normal limits for age. Please
correlate with surgical history.

Vertebral augmentation is evident at L4. Superior endplate Schmorl's
nodes are present at L1 and L2. A remote superior endplate fracture
is present at L1 as well. Facet degenerative changes are evident at
L4-5 and L5-S1. Degenerative changes are most prominent on the right
at L5-S1.
IMPRESSION: 1. Benign appearing 11 mm low-density cystic lesion at the tail of
the pancreas. There is no associated soft tissue mass. If previous
imaging is available instability is confirmed, this may be adequate
for follow-up. If such imaging is not available, follow-up imaging
in 1 year, preferably MRI without and with contrast is recommended.
2. Moderate stool throughout the colon compatible with constipation.
No obstructing mass lesion is evident.
3. Atherosclerosis with ectasia of the infrarenal abdominal aorta.
There is no aneurysm.
4. Remote healed superior endplate fracture at L1.
5. Spinal augmentation noted at L4.
6. Degenerative changes at L4-5 and L5-S1.

## 2016-12-09 DEATH — deceased

## 2016-12-20 ENCOUNTER — Encounter: Payer: Self-pay | Admitting: Gastroenterology
